# Patient Record
Sex: Female | Born: 1970
Health system: Southern US, Community
[De-identification: ages and names within clinical notes are randomized; demographics above are authoritative.]

## PROBLEM LIST (undated history)

## (undated) ENCOUNTER — Ambulatory Visit (HOSPITAL_COMMUNITY)

## (undated) DIAGNOSIS — I10 Essential (primary) hypertension: Secondary | ICD-10-CM

## (undated) DIAGNOSIS — F32A Depression, unspecified: Secondary | ICD-10-CM

## (undated) DIAGNOSIS — F329 Major depressive disorder, single episode, unspecified: Secondary | ICD-10-CM

## (undated) DIAGNOSIS — K219 Gastro-esophageal reflux disease without esophagitis: Secondary | ICD-10-CM

## (undated) HISTORY — PX: NO PAST SURGERIES: SHX2092

---
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
## 2011-06-12 ENCOUNTER — Other Ambulatory Visit (HOSPITAL_COMMUNITY)
Admission: RE | Admit: 2011-06-12 | Discharge: 2011-06-12 | Disposition: A | Discharge status: Home / Self Care | Payer: 59 | Source: Ambulatory Visit | Attending: Internal Medicine | Admitting: Internal Medicine

## 2011-06-12 DIAGNOSIS — Z01419 Encounter for gynecological examination (general) (routine) without abnormal findings: Secondary | ICD-10-CM | POA: Insufficient documentation

## 2011-06-30 ENCOUNTER — Encounter: Payer: Self-pay | Admitting: Gastroenterology

## 2011-11-21 ENCOUNTER — Encounter: Payer: Self-pay | Admitting: Gastroenterology

## 2012-12-19 ENCOUNTER — Other Ambulatory Visit: Payer: Self-pay

## 2012-12-19 DIAGNOSIS — Z1231 Encounter for screening mammogram for malignant neoplasm of breast: Secondary | ICD-10-CM

## 2013-01-03 ENCOUNTER — Ambulatory Visit
Admission: RE | Admit: 2013-01-03 | Discharge: 2013-01-03 | Disposition: A | Discharge status: Home / Self Care | Payer: 59 | Source: Ambulatory Visit

## 2013-01-03 DIAGNOSIS — Z1231 Encounter for screening mammogram for malignant neoplasm of breast: Secondary | ICD-10-CM

## 2013-01-08 ENCOUNTER — Other Ambulatory Visit: Payer: Self-pay | Admitting: Family Medicine

## 2013-01-08 ENCOUNTER — Ambulatory Visit
Admission: RE | Admit: 2013-01-08 | Discharge: 2013-01-08 | Disposition: A | Discharge status: Home / Self Care | Payer: 59 | Source: Ambulatory Visit | Attending: Family Medicine | Admitting: Family Medicine

## 2013-01-08 DIAGNOSIS — R0600 Dyspnea, unspecified: Secondary | ICD-10-CM

## 2013-08-27 ENCOUNTER — Other Ambulatory Visit: Payer: Self-pay | Admitting: Obstetrics and Gynecology

## 2013-08-27 DIAGNOSIS — D259 Leiomyoma of uterus, unspecified: Secondary | ICD-10-CM

## 2013-08-27 DIAGNOSIS — R319 Hematuria, unspecified: Secondary | ICD-10-CM

## 2013-09-02 ENCOUNTER — Ambulatory Visit
Admission: RE | Admit: 2013-09-02 | Discharge: 2013-09-02 | Disposition: A | Discharge status: Home / Self Care | Payer: 59 | Source: Ambulatory Visit | Attending: Obstetrics and Gynecology | Admitting: Obstetrics and Gynecology

## 2013-09-02 DIAGNOSIS — D259 Leiomyoma of uterus, unspecified: Secondary | ICD-10-CM

## 2013-09-02 DIAGNOSIS — R319 Hematuria, unspecified: Secondary | ICD-10-CM

## 2013-09-02 MED ORDER — GADOBENATE DIMEGLUMINE 529 MG/ML IV SOLN
19.0000 mL | Freq: Once | INTRAVENOUS | Status: AC | PRN
Start: 2013-09-02 — End: 2013-09-02
  Administered 2013-09-02: 19 mL via INTRAVENOUS

## 2013-11-18 ENCOUNTER — Ambulatory Visit
Admission: RE | Admit: 2013-11-18 | Discharge: 2013-11-18 | Disposition: A | Discharge status: Home / Self Care | Payer: 59 | Source: Ambulatory Visit

## 2013-11-18 ENCOUNTER — Encounter (INDEPENDENT_AMBULATORY_CARE_PROVIDER_SITE_OTHER): Payer: Self-pay

## 2013-11-18 ENCOUNTER — Other Ambulatory Visit: Payer: Self-pay

## 2013-11-18 DIAGNOSIS — Z1231 Encounter for screening mammogram for malignant neoplasm of breast: Secondary | ICD-10-CM

## 2015-04-15 ENCOUNTER — Other Ambulatory Visit: Payer: Self-pay | Admitting: Urology

## 2015-04-15 DIAGNOSIS — N361 Urethral diverticulum: Secondary | ICD-10-CM

## 2015-04-26 ENCOUNTER — Ambulatory Visit (HOSPITAL_COMMUNITY)
Admission: RE | Admit: 2015-04-26 | Discharge: 2015-04-26 | Disposition: A | Discharge status: Home / Self Care | Payer: 59 | Source: Ambulatory Visit | Attending: Urology | Admitting: Urology

## 2015-04-26 DIAGNOSIS — N361 Urethral diverticulum: Secondary | ICD-10-CM | POA: Insufficient documentation

## 2015-04-26 DIAGNOSIS — D251 Intramural leiomyoma of uterus: Secondary | ICD-10-CM | POA: Insufficient documentation

## 2015-04-26 DIAGNOSIS — N750 Cyst of Bartholin's gland: Secondary | ICD-10-CM | POA: Diagnosis not present

## 2015-04-26 LAB — POCT I-STAT CREATININE: CREATININE: 1.4 mg/dL — AB (ref 0.44–1.00)

## 2015-04-26 MED ORDER — GADOBENATE DIMEGLUMINE 529 MG/ML IV SOLN
20.0000 mL | Freq: Once | INTRAVENOUS | Status: AC | PRN
Start: 2015-04-26 — End: 2015-04-26
  Administered 2015-04-26: 20 mL via INTRAVENOUS

## 2015-05-28 ENCOUNTER — Inpatient Hospital Stay (HOSPITAL_COMMUNITY): Admission: RE | Admit: 2015-05-28 | Payer: 59 | Source: Ambulatory Visit | Admitting: Obstetrics and Gynecology

## 2015-05-28 ENCOUNTER — Encounter (HOSPITAL_COMMUNITY): Admission: RE | Payer: Self-pay | Source: Ambulatory Visit

## 2015-05-28 SURGERY — MYOMECTOMY, ABDOMINAL APPROACH
Anesthesia: General

## 2015-06-30 ENCOUNTER — Other Ambulatory Visit: Payer: Self-pay | Admitting: Obstetrics and Gynecology

## 2015-07-09 ENCOUNTER — Other Ambulatory Visit: Payer: Self-pay

## 2015-07-09 ENCOUNTER — Encounter (HOSPITAL_COMMUNITY): Payer: Self-pay

## 2015-07-09 ENCOUNTER — Encounter (HOSPITAL_COMMUNITY)
Admission: RE | Admit: 2015-07-09 | Discharge: 2015-07-09 | Disposition: A | Discharge status: Home / Self Care | Payer: 59 | Source: Ambulatory Visit | Attending: Obstetrics and Gynecology | Admitting: Obstetrics and Gynecology

## 2015-07-09 DIAGNOSIS — D509 Iron deficiency anemia, unspecified: Secondary | ICD-10-CM | POA: Diagnosis not present

## 2015-07-09 DIAGNOSIS — D259 Leiomyoma of uterus, unspecified: Secondary | ICD-10-CM | POA: Diagnosis not present

## 2015-07-09 DIAGNOSIS — Z79899 Other long term (current) drug therapy: Secondary | ICD-10-CM | POA: Diagnosis not present

## 2015-07-09 DIAGNOSIS — Z01812 Encounter for preprocedural laboratory examination: Secondary | ICD-10-CM | POA: Diagnosis not present

## 2015-07-09 DIAGNOSIS — Z975 Presence of (intrauterine) contraceptive device: Secondary | ICD-10-CM | POA: Insufficient documentation

## 2015-07-09 DIAGNOSIS — K219 Gastro-esophageal reflux disease without esophagitis: Secondary | ICD-10-CM | POA: Diagnosis not present

## 2015-07-09 DIAGNOSIS — Z01810 Encounter for preprocedural cardiovascular examination: Secondary | ICD-10-CM | POA: Diagnosis present

## 2015-07-09 DIAGNOSIS — F329 Major depressive disorder, single episode, unspecified: Secondary | ICD-10-CM | POA: Insufficient documentation

## 2015-07-09 DIAGNOSIS — I10 Essential (primary) hypertension: Secondary | ICD-10-CM | POA: Diagnosis not present

## 2015-07-09 DIAGNOSIS — F419 Anxiety disorder, unspecified: Secondary | ICD-10-CM | POA: Insufficient documentation

## 2015-07-09 DIAGNOSIS — Z6839 Body mass index (BMI) 39.0-39.9, adult: Secondary | ICD-10-CM | POA: Insufficient documentation

## 2015-07-09 HISTORY — DX: Gastro-esophageal reflux disease without esophagitis: K21.9

## 2015-07-09 HISTORY — DX: Essential (primary) hypertension: I10

## 2015-07-09 LAB — BASIC METABOLIC PANEL
Anion gap: 8 (ref 5–15)
BUN: 8 mg/dL (ref 6–20)
CALCIUM: 9.6 mg/dL (ref 8.9–10.3)
CO2: 27 mmol/L (ref 22–32)
Chloride: 101 mmol/L (ref 101–111)
Creatinine, Ser: 1.06 mg/dL — ABNORMAL HIGH (ref 0.44–1.00)
GLUCOSE: 91 mg/dL (ref 65–99)
Potassium: 3.9 mmol/L (ref 3.5–5.1)
Sodium: 136 mmol/L (ref 135–145)

## 2015-07-09 LAB — CBC
HCT: 36.5 % (ref 36.0–46.0)
Hemoglobin: 12.1 g/dL (ref 12.0–15.0)
MCH: 28.6 pg (ref 26.0–34.0)
MCHC: 33.2 g/dL (ref 30.0–36.0)
MCV: 86.3 fL (ref 78.0–100.0)
PLATELETS: 326 10*3/uL (ref 150–400)
RBC: 4.23 MIL/uL (ref 3.87–5.11)
RDW: 14 % (ref 11.5–15.5)
WBC: 7.9 10*3/uL (ref 4.0–10.5)

## 2015-07-09 NOTE — Patient Instructions (Addendum)
Your procedure is scheduled on:07/14/15  Enter through the Main Entrance at : 10:45 am Pick up desk phone and dial [phone] and inform us of your arrival.  Please call [phone] if you have any problems the morning of surgery.  Remember: Do not eat food after midnight:Tuesday Clear liquids are ok until:8am on WED   You may brush your teeth the morning of surgery.  Take these meds the morning of surgery with a sip of water:BP pill, Omeprazole  DO NOT wear jewelry, eye make-up, lipstick,body lotion, or dark fingernail polish.  (Polished toes are ok) You may wear deodorant.  If you are to be admitted after surgery, leave suitcase in car until your room has been assigned. Patients discharged on the day of surgery will not be allowed to drive home. Wear loose fitting, comfortable clothes for your ride home.

## 2015-07-13 MED ORDER — CEFAZOLIN SODIUM-DEXTROSE 2-3 GM-% IV SOLR
2.0000 g | INTRAVENOUS | Status: AC
  Administered 2015-07-14: 2 g via INTRAVENOUS

## 2015-07-13 NOTE — Anesthesia Preprocedure Evaluation (Addendum)
Anesthesia Evaluation  Patient identified by MRN, date of birth, ID band Patient awake    Reviewed: Allergy & Precautions, H&P , NPO status , Patient's Chart, lab work & pertinent test results  History of Anesthesia Complications Negative for: history of anesthetic complications  Airway Mallampati: I  TM Distance: >3 FB Neck ROM: full    Dental no notable dental hx.    Pulmonary neg pulmonary ROS,    Pulmonary exam normal breath sounds clear to auscultation       Cardiovascular hypertension, Pt. on medications Normal cardiovascular exam Rhythm:regular Rate:Normal     Neuro/Psych negative neurological ROS     GI/Hepatic Neg liver ROS, GERD  Medicated,  Endo/Other  Morbid obesity  Renal/GU negative Renal ROS     Musculoskeletal   Abdominal   Peds  Hematology negative hematology ROS (+)   Anesthesia Other Findings MRI reviewed: multiple uterine fibroids with largest measuring 12 x 8 x 12 cm  Hgb is 12 preop, Type and screen pending  Reproductive/Obstetrics negative OB ROS                          Anesthesia Physical Anesthesia Plan  ASA: III  Anesthesia Plan: General   Post-op Pain Management:    Induction: Intravenous  Airway Management Planned: Oral ETT  Additional Equipment: None  Intra-op Plan:   Post-operative Plan: Extubation in OR  Informed Consent: I have reviewed the patients History and Physical, chart, labs and discussed the procedure including the risks, benefits and alternatives for the proposed anesthesia with the patient or authorized representative who has indicated his/her understanding and acceptance.   Dental Advisory Given  Plan Discussed with: Anesthesiologist and CRNA  Anesthesia Plan Comments: (GA with ETT  Please get 2 PIVs)       Anesthesia Quick Evaluation

## 2015-07-14 ENCOUNTER — Encounter (HOSPITAL_COMMUNITY): Payer: Self-pay | Admitting: Anesthesiology

## 2015-07-14 ENCOUNTER — Encounter (HOSPITAL_COMMUNITY)
Admission: RE | Disposition: A | Discharge status: Home / Self Care | Payer: Self-pay | Source: Ambulatory Visit | Attending: Obstetrics and Gynecology

## 2015-07-14 ENCOUNTER — Inpatient Hospital Stay (HOSPITAL_COMMUNITY): Payer: 59 | Admitting: Anesthesiology

## 2015-07-14 ENCOUNTER — Inpatient Hospital Stay (HOSPITAL_COMMUNITY)
Admission: RE | Admit: 2015-07-14 | Discharge: 2015-07-15 | DRG: 743 | Disposition: A | Discharge status: Home / Self Care | Payer: 59 | Source: Ambulatory Visit | Attending: Obstetrics and Gynecology | Admitting: Obstetrics and Gynecology

## 2015-07-14 DIAGNOSIS — N920 Excessive and frequent menstruation with regular cycle: Secondary | ICD-10-CM | POA: Diagnosis present

## 2015-07-14 DIAGNOSIS — Z9889 Other specified postprocedural states: Secondary | ICD-10-CM

## 2015-07-14 DIAGNOSIS — Z6838 Body mass index (BMI) 38.0-38.9, adult: Secondary | ICD-10-CM | POA: Diagnosis not present

## 2015-07-14 DIAGNOSIS — Z30432 Encounter for removal of intrauterine contraceptive device: Secondary | ICD-10-CM | POA: Diagnosis not present

## 2015-07-14 DIAGNOSIS — D649 Anemia, unspecified: Secondary | ICD-10-CM | POA: Diagnosis present

## 2015-07-14 DIAGNOSIS — I10 Essential (primary) hypertension: Secondary | ICD-10-CM | POA: Diagnosis present

## 2015-07-14 DIAGNOSIS — D259 Leiomyoma of uterus, unspecified: Secondary | ICD-10-CM | POA: Diagnosis present

## 2015-07-14 DIAGNOSIS — K219 Gastro-esophageal reflux disease without esophagitis: Secondary | ICD-10-CM | POA: Diagnosis present

## 2015-07-14 HISTORY — PX: IUD REMOVAL: SHX5392

## 2015-07-14 HISTORY — PX: MYOMECTOMY: SHX85

## 2015-07-14 LAB — ABO/RH: ABO/RH(D): O POS

## 2015-07-14 LAB — PREGNANCY, URINE: Preg Test, Ur: NEGATIVE

## 2015-07-14 LAB — TYPE AND SCREEN
ABO/RH(D): O POS
ANTIBODY SCREEN: NEGATIVE

## 2015-07-14 SURGERY — MYOMECTOMY, ABDOMINAL APPROACH
Anesthesia: General | Site: Vagina

## 2015-07-14 MED ORDER — LACTATED RINGERS IV SOLN
INTRAVENOUS | Status: DC
  Administered 2015-07-14 (×2): via INTRAVENOUS

## 2015-07-14 MED ORDER — CEFAZOLIN SODIUM-DEXTROSE 2-3 GM-% IV SOLR
INTRAVENOUS | Status: AC
  Filled 2015-07-14: qty 50

## 2015-07-14 MED ORDER — HYDROMORPHONE HCL 1 MG/ML IJ SOLN
INTRAMUSCULAR | Status: DC | PRN
  Administered 2015-07-14 (×2): 0.5 mg via INTRAVENOUS

## 2015-07-14 MED ORDER — SODIUM CHLORIDE 0.9% FLUSH: 9.0000 mL | INTRAVENOUS | Status: DC | PRN

## 2015-07-14 MED ORDER — HYDROMORPHONE 1 MG/ML IV SOLN
INTRAVENOUS | Status: DC
  Administered 2015-07-14: 0.4 mg via INTRAVENOUS
  Administered 2015-07-14: 14:00:00 via INTRAVENOUS
  Administered 2015-07-14: 0.6 mg via INTRAVENOUS
  Administered 2015-07-15: 0.2 mg via INTRAVENOUS
  Filled 2015-07-14: qty 25

## 2015-07-14 MED ORDER — DIPHENHYDRAMINE HCL 50 MG/ML IJ SOLN
12.5000 mg | Freq: Four times a day (QID) | INTRAMUSCULAR | Status: DC | PRN
Start: 2015-07-14 — End: 2015-07-15

## 2015-07-14 MED ORDER — CHLOROPROCAINE HCL 1 % IJ SOLN
INTRAMUSCULAR | Status: AC
  Filled 2015-07-14: qty 30

## 2015-07-14 MED ORDER — VASOPRESSIN 20 UNIT/ML IV SOLN
INTRAVENOUS | Status: AC
  Filled 2015-07-14: qty 1

## 2015-07-14 MED ORDER — LOSARTAN POTASSIUM-HCTZ 100-12.5 MG PO TABS: 1.0000 | ORAL_TABLET | Freq: Every day | ORAL | Status: DC

## 2015-07-14 MED ORDER — HYDROMORPHONE HCL 1 MG/ML IJ SOLN: 0.2000 mg | INTRAMUSCULAR | Status: DC | PRN

## 2015-07-14 MED ORDER — PROMETHAZINE HCL 25 MG/ML IJ SOLN: 6.2500 mg | INTRAMUSCULAR | Status: DC | PRN

## 2015-07-14 MED ORDER — SIMETHICONE 80 MG PO CHEW: 80.0000 mg | CHEWABLE_TABLET | Freq: Four times a day (QID) | ORAL | Status: DC | PRN

## 2015-07-14 MED ORDER — HYDROMORPHONE HCL 1 MG/ML IJ SOLN
0.2500 mg | INTRAMUSCULAR | Status: DC | PRN
  Administered 2015-07-14 (×4): 0.5 mg via INTRAVENOUS

## 2015-07-14 MED ORDER — SODIUM CHLORIDE 0.9 % IJ SOLN
INTRAMUSCULAR | Status: AC
  Filled 2015-07-14: qty 50

## 2015-07-14 MED ORDER — PROPOFOL 10 MG/ML IV BOLUS
INTRAVENOUS | Status: AC
  Filled 2015-07-14: qty 20

## 2015-07-14 MED ORDER — FENTANYL CITRATE (PF) 250 MCG/5ML IJ SOLN
INTRAMUSCULAR | Status: AC
  Filled 2015-07-14: qty 5

## 2015-07-14 MED ORDER — ONDANSETRON HCL 4 MG/2ML IJ SOLN: 4.0000 mg | Freq: Four times a day (QID) | INTRAMUSCULAR | Status: DC | PRN

## 2015-07-14 MED ORDER — LIDOCAINE HCL (CARDIAC) 20 MG/ML IV SOLN
INTRAVENOUS | Status: DC | PRN
  Administered 2015-07-14: 7 mg via INTRAVENOUS
  Administered 2015-07-14: 30 mg via INTRAVENOUS

## 2015-07-14 MED ORDER — KETOROLAC TROMETHAMINE 30 MG/ML IJ SOLN
INTRAMUSCULAR | Status: AC
  Filled 2015-07-14: qty 1

## 2015-07-14 MED ORDER — PANTOPRAZOLE SODIUM 40 MG PO TBEC
40.0000 mg | DELAYED_RELEASE_TABLET | Freq: Every day | ORAL | Status: DC
  Administered 2015-07-14 – 2015-07-15 (×2): 40 mg via ORAL
  Filled 2015-07-14 (×2): qty 1

## 2015-07-14 MED ORDER — BUPIVACAINE HCL (PF) 0.25 % IJ SOLN
INTRAMUSCULAR | Status: DC | PRN
  Administered 2015-07-14: 8 mL

## 2015-07-14 MED ORDER — BUPIVACAINE HCL (PF) 0.25 % IJ SOLN
INTRAMUSCULAR | Status: AC
  Filled 2015-07-14: qty 30

## 2015-07-14 MED ORDER — NEOSTIGMINE METHYLSULFATE 10 MG/10ML IV SOLN
INTRAVENOUS | Status: AC
  Filled 2015-07-14: qty 1

## 2015-07-14 MED ORDER — ROCURONIUM BROMIDE 100 MG/10ML IV SOLN
INTRAVENOUS | Status: AC
  Filled 2015-07-14: qty 1

## 2015-07-14 MED ORDER — DEXAMETHASONE SODIUM PHOSPHATE 10 MG/ML IJ SOLN
INTRAMUSCULAR | Status: DC | PRN
  Administered 2015-07-14: 4 mg via INTRAVENOUS

## 2015-07-14 MED ORDER — ONDANSETRON HCL 4 MG/2ML IJ SOLN
INTRAMUSCULAR | Status: DC | PRN
  Administered 2015-07-14: 4 mg via INTRAVENOUS

## 2015-07-14 MED ORDER — DEXAMETHASONE SODIUM PHOSPHATE 4 MG/ML IJ SOLN
INTRAMUSCULAR | Status: AC
  Filled 2015-07-14: qty 1

## 2015-07-14 MED ORDER — KETOROLAC TROMETHAMINE 30 MG/ML IJ SOLN: 30.0000 mg | Freq: Four times a day (QID) | INTRAMUSCULAR | Status: DC

## 2015-07-14 MED ORDER — HYDROMORPHONE HCL 1 MG/ML IJ SOLN
INTRAMUSCULAR | Status: AC
  Filled 2015-07-14: qty 1

## 2015-07-14 MED ORDER — PROPOFOL 10 MG/ML IV BOLUS
INTRAVENOUS | Status: DC | PRN
  Administered 2015-07-14: 170 mg via INTRAVENOUS
  Administered 2015-07-14: 30 mg via INTRAVENOUS

## 2015-07-14 MED ORDER — ROCURONIUM BROMIDE 100 MG/10ML IV SOLN
INTRAVENOUS | Status: DC | PRN
  Administered 2015-07-14: 10 mg via INTRAVENOUS
  Administered 2015-07-14: 50 mg via INTRAVENOUS

## 2015-07-14 MED ORDER — SCOPOLAMINE 1 MG/3DAYS TD PT72
1.0000 | MEDICATED_PATCH | Freq: Once | TRANSDERMAL | Status: DC
  Administered 2015-07-14: 1.5 mg via TRANSDERMAL

## 2015-07-14 MED ORDER — LOSARTAN POTASSIUM 50 MG PO TABS
100.0000 mg | ORAL_TABLET | Freq: Every day | ORAL | Status: DC
  Administered 2015-07-14 – 2015-07-15 (×2): 100 mg via ORAL
  Filled 2015-07-14 (×3): qty 2

## 2015-07-14 MED ORDER — SCOPOLAMINE 1 MG/3DAYS TD PT72
MEDICATED_PATCH | TRANSDERMAL | Status: AC
  Administered 2015-07-14: 1.5 mg via TRANSDERMAL
  Filled 2015-07-14: qty 1

## 2015-07-14 MED ORDER — GLYCOPYRROLATE 0.2 MG/ML IJ SOLN
INTRAMUSCULAR | Status: DC | PRN
  Administered 2015-07-14: 0.6 mg via INTRAVENOUS

## 2015-07-14 MED ORDER — KETOROLAC TROMETHAMINE 30 MG/ML IJ SOLN
INTRAMUSCULAR | Status: DC | PRN
  Administered 2015-07-14: 30 mg via INTRAVENOUS

## 2015-07-14 MED ORDER — SODIUM CHLORIDE 0.9 % IV SOLN
INTRAVENOUS | Status: DC | PRN
  Administered 2015-07-14: 42 mL via INTRAMUSCULAR

## 2015-07-14 MED ORDER — ONDANSETRON HCL 4 MG/2ML IJ SOLN
INTRAMUSCULAR | Status: AC
  Filled 2015-07-14: qty 2

## 2015-07-14 MED ORDER — HYDROCHLOROTHIAZIDE 12.5 MG PO CAPS
12.5000 mg | ORAL_CAPSULE | Freq: Every day | ORAL | Status: DC
  Administered 2015-07-14 – 2015-07-15 (×2): 12.5 mg via ORAL
  Filled 2015-07-14 (×3): qty 1

## 2015-07-14 MED ORDER — OXYCODONE-ACETAMINOPHEN 5-325 MG PO TABS
1.0000 | ORAL_TABLET | ORAL | Status: DC | PRN
  Administered 2015-07-15 (×4): 1 via ORAL
  Filled 2015-07-14 (×4): qty 1

## 2015-07-14 MED ORDER — ONDANSETRON HCL 4 MG PO TABS: 4.0000 mg | ORAL_TABLET | Freq: Four times a day (QID) | ORAL | Status: DC | PRN

## 2015-07-14 MED ORDER — MENTHOL 3 MG MT LOZG: 1.0000 | LOZENGE | OROMUCOSAL | Status: DC | PRN

## 2015-07-14 MED ORDER — GLYCOPYRROLATE 0.2 MG/ML IJ SOLN
INTRAMUSCULAR | Status: AC
  Filled 2015-07-14: qty 3

## 2015-07-14 MED ORDER — VASOPRESSIN 20 UNIT/ML IV SOLN
INTRAVENOUS | Status: AC
Start: 2015-07-14 — End: 2015-07-14
  Filled 2015-07-14: qty 3

## 2015-07-14 MED ORDER — DEXTROSE IN LACTATED RINGERS 5 % IV SOLN
INTRAVENOUS | Status: DC
  Administered 2015-07-14 (×2): via INTRAVENOUS

## 2015-07-14 MED ORDER — FENTANYL CITRATE (PF) 100 MCG/2ML IJ SOLN
INTRAMUSCULAR | Status: DC | PRN
  Administered 2015-07-14 (×2): 50 ug via INTRAVENOUS
  Administered 2015-07-14: 100 ug via INTRAVENOUS
  Administered 2015-07-14: 50 ug via INTRAVENOUS

## 2015-07-14 MED ORDER — MIDAZOLAM HCL 2 MG/2ML IJ SOLN
INTRAMUSCULAR | Status: DC | PRN
  Administered 2015-07-14 (×2): 1 mg via INTRAVENOUS

## 2015-07-14 MED ORDER — NEOSTIGMINE METHYLSULFATE 10 MG/10ML IV SOLN
INTRAVENOUS | Status: DC | PRN
  Administered 2015-07-14: 4 mg via INTRAVENOUS

## 2015-07-14 MED ORDER — NALOXONE HCL 0.4 MG/ML IJ SOLN: 0.4000 mg | INTRAMUSCULAR | Status: DC | PRN

## 2015-07-14 MED ORDER — SODIUM CHLORIDE 0.9 % IJ SOLN
INTRAMUSCULAR | Status: AC
  Filled 2015-07-14: qty 100

## 2015-07-14 MED ORDER — IBUPROFEN 800 MG PO TABS
800.0000 mg | ORAL_TABLET | Freq: Three times a day (TID) | ORAL | Status: DC | PRN
  Administered 2015-07-15 (×2): 800 mg via ORAL
  Filled 2015-07-14 (×2): qty 1

## 2015-07-14 MED ORDER — KETOROLAC TROMETHAMINE 30 MG/ML IJ SOLN
30.0000 mg | Freq: Four times a day (QID) | INTRAMUSCULAR | Status: DC
  Administered 2015-07-14 (×2): 30 mg via INTRAVENOUS
  Filled 2015-07-14 (×3): qty 1

## 2015-07-14 MED ORDER — LIDOCAINE HCL (CARDIAC) 20 MG/ML IV SOLN
INTRAVENOUS | Status: AC
  Filled 2015-07-14: qty 5

## 2015-07-14 MED ORDER — DIPHENHYDRAMINE HCL 12.5 MG/5ML PO ELIX: 12.5000 mg | ORAL_SOLUTION | Freq: Four times a day (QID) | ORAL | Status: DC | PRN

## 2015-07-14 MED ORDER — MIDAZOLAM HCL 2 MG/2ML IJ SOLN
INTRAMUSCULAR | Status: AC
  Filled 2015-07-14: qty 2

## 2015-07-14 SURGICAL SUPPLY — 46 items
BARRIER ADHS 3X4 INTERCEED (GAUZE/BANDAGES/DRESSINGS) ×12 IMPLANT
CANISTER SUCT 3000ML (MISCELLANEOUS) ×4 IMPLANT
CATH ROBINSON RED A/P 16FR (CATHETERS) IMPLANT
CLOTH BEACON ORANGE TIMEOUT ST (SAFETY) ×4 IMPLANT
CONT PATH 16OZ SNAP LID 3702 (MISCELLANEOUS) ×4 IMPLANT
CONT SPEC PATH 64OZ SNAP LID (MISCELLANEOUS) ×4 IMPLANT
CONTAINER PREFILL 10% NBF 60ML (FORM) IMPLANT
DECANTER SPIKE VIAL GLASS SM (MISCELLANEOUS) ×4 IMPLANT
DRAPE CESAREAN BIRTH W POUCH (DRAPES) ×4 IMPLANT
DRSG OPSITE POSTOP 4X10 (GAUZE/BANDAGES/DRESSINGS) ×4 IMPLANT
DURAPREP 26ML APPLICATOR (WOUND CARE) ×4 IMPLANT
ELECT NEEDLE TIP 2.8 STRL (NEEDLE) ×4 IMPLANT
FILTER STRAW FLUID ASPIR (MISCELLANEOUS) IMPLANT
GAUZE SPONGE 4X4 16PLY XRAY LF (GAUZE/BANDAGES/DRESSINGS) ×4 IMPLANT
GLOVE BIOGEL PI IND STRL 7.0 (GLOVE) ×6 IMPLANT
GLOVE BIOGEL PI INDICATOR 7.0 (GLOVE) ×6
GLOVE ECLIPSE 6.5 STRL STRAW (GLOVE) ×4 IMPLANT
GOWN STRL REUS W/TWL LRG LVL3 (GOWN DISPOSABLE) ×12 IMPLANT
NEEDLE HYPO 22GX1.5 SAFETY (NEEDLE) ×4 IMPLANT
NEEDLE SPNL 22GX3.5 QUINCKE BK (NEEDLE) ×4 IMPLANT
NS IRRIG 1000ML POUR BTL (IV SOLUTION) ×4 IMPLANT
PACK ABDOMINAL GYN (CUSTOM PROCEDURE TRAY) ×4 IMPLANT
PACK VAGINAL MINOR WOMEN LF (CUSTOM PROCEDURE TRAY) ×4 IMPLANT
PAD OB MATERNITY 4.3X12.25 (PERSONAL CARE ITEMS) ×4 IMPLANT
PAD PREP 24X48 CUFFED NSTRL (MISCELLANEOUS) IMPLANT
RETRACTOR WND ALEXIS 25 LRG (MISCELLANEOUS) ×2 IMPLANT
RTRCTR WOUND ALEXIS 25CM LRG (MISCELLANEOUS) ×4
SPONGE GAUZE 4X4 12PLY STER LF (GAUZE/BANDAGES/DRESSINGS) ×4 IMPLANT
SPONGE LAP 18X18 X RAY DECT (DISPOSABLE) ×4 IMPLANT
STAPLER VISISTAT 35W (STAPLE) IMPLANT
SUT MON AB 3-0 SH 27 (SUTURE) ×2
SUT MON AB 3-0 SH27 (SUTURE) ×2 IMPLANT
SUT MON AB 4-0 PS1 27 (SUTURE) ×8 IMPLANT
SUT PLAIN 2 0 XLH (SUTURE) IMPLANT
SUT VIC AB 0 CT1 18XCR BRD8 (SUTURE) ×6 IMPLANT
SUT VIC AB 0 CT1 36 (SUTURE) ×8 IMPLANT
SUT VIC AB 0 CT1 8-18 (SUTURE) ×6
SUT VIC AB 2-0 CT1 27 (SUTURE) ×2
SUT VIC AB 2-0 CT1 TAPERPNT 27 (SUTURE) ×2 IMPLANT
SUT VIC AB 2-0 SH 27 (SUTURE) ×4
SUT VIC AB 2-0 SH 27XBRD (SUTURE) ×4 IMPLANT
SUT VIC AB 4-0 PS2 27 (SUTURE) IMPLANT
SYR CONTROL 10ML LL (SYRINGE) ×8 IMPLANT
TOWEL OR 17X24 6PK STRL BLUE (TOWEL DISPOSABLE) ×8 IMPLANT
TRAY FOLEY CATH SILVER 14FR (SET/KITS/TRAYS/PACK) ×4 IMPLANT
WATER STERILE IRR 1000ML POUR (IV SOLUTION) IMPLANT

## 2015-07-14 NOTE — Transfer of Care (Signed)
Immediate Anesthesia Transfer of Care Note  Patient: Annette Diaz  Procedure(s) Performed: Procedure(s): Exploratory Laparotomy MYOMECTOMY (N/A) INTRAUTERINE DEVICE (IUD) REMOVAL (N/A)  Patient Location: PACU  Anesthesia Type:General  Level of Consciousness: awake, oriented and patient cooperative  Airway & Oxygen Therapy: Patient Spontanous Breathing and Patient connected to nasal cannula oxygen  Post-op Assessment: Report given to RN and Post -op Vital signs reviewed and stable  Post vital signs: Reviewed and stable  Last Vitals:  Filed Vitals:   07/14/15 0840  BP: 127/83  Pulse: 77  Temp: 36.7 C  Resp: 18    Complications: No apparent anesthesia complications

## 2015-07-14 NOTE — Anesthesia Procedure Notes (Signed)
Procedure Name: Intubation Date/Time: 07/14/2015 9:31 AM Performed by: Tobin Chad Pre-anesthesia Checklist: Patient identified, Timeout performed, Emergency Drugs available, Suction available and Patient being monitored Patient Re-evaluated:Patient Re-evaluated prior to inductionOxygen Delivery Method: Circle system utilized and Simple face mask Preoxygenation: Pre-oxygenation with 100% oxygen Intubation Type: IV induction and Inhalational induction Ventilation: Mask ventilation without difficulty Laryngoscope Size: Mac and 3 Grade View: Grade I Tube type: Oral Tube size: 7.0 mm Number of attempts: 1 Placement Confirmation: ETT inserted through vocal cords under direct vision,  breath sounds checked- equal and bilateral and positive ETCO2 Secured at: 22 cm Tube secured with: Tape Dental Injury: Teeth and Oropharynx as per pre-operative assessment

## 2015-07-14 NOTE — Anesthesia Postprocedure Evaluation (Signed)
Anesthesia Post Note  Patient: Metallurgist  Procedure(s) Performed: Procedure(s) (LRB): Exploratory Laparotomy MYOMECTOMY (N/A) INTRAUTERINE DEVICE (IUD) REMOVAL (N/A)  Patient location during evaluation: PACU Anesthesia Type: General Level of consciousness: awake and alert Pain management: pain level controlled Vital Signs Assessment: post-procedure vital signs reviewed and stable Respiratory status: spontaneous breathing Cardiovascular status: blood pressure returned to baseline Anesthetic complications: no    Last Vitals:  Filed Vitals:   07/14/15 1348 07/14/15 1358  BP: 122/68   Pulse: 75   Temp: 36.7 C   Resp: 20 14    Last Pain:  Filed Vitals:   07/14/15 1401  PainSc: 0-No pain                 Tiajuana Amass

## 2015-07-14 NOTE — Brief Op Note (Signed)
07/14/2015  12:10 PM  PATIENT:  Annette Diaz  45 y.o. female  PRE-OPERATIVE DIAGNOSIS:  Fibroid Uterus, Anemia, Menorrhagia, IUD removal  POST-OPERATIVE DIAGNOSIS:  Fibroid Uterus, Anemia, Menorrhagia, IUD removal  PROCEDURE:  IUD removal, exploratory laparotomy, myomectomy  SURGEON:  Surgeon(s) and Role:    * Servando Salina, MD - Primary  PHYSICIAN ASSISTANT:   ASSISTANTS: Artelia Laroche, CNM   ANESTHESIA:   general Findings: large ant IM fibroid, several smaller fibroid, left ovary with adhesion lysed, nl right ov, nl tubes EBL:  Total I/O In: 1800 [I.V.:1800] Out: 650 [Urine:550; Blood:100]  BLOOD ADMINISTERED:none  DRAINS: none   LOCAL MEDICATIONS USED:  MARCAINE     SPECIMEN:  Source of Specimen:  myoma  DISPOSITION OF SPECIMEN:  PATHOLOGY  COUNTS:  YES  TOURNIQUET:  * No tourniquets in log *  DICTATION: .Other Dictation: Dictation Number [id number]  PLAN OF CARE: Admit to inpatient   PATIENT DISPOSITION:  PACU - hemodynamically stable.   Delay start of Pharmacological VTE agent (>24hrs) due to surgical blood loss or risk of bleeding: no

## 2015-07-15 ENCOUNTER — Encounter (HOSPITAL_COMMUNITY): Payer: Self-pay | Admitting: Obstetrics and Gynecology

## 2015-07-15 LAB — CBC
HEMATOCRIT: 30.4 % — AB (ref 36.0–46.0)
Hemoglobin: 10.1 g/dL — ABNORMAL LOW (ref 12.0–15.0)
MCH: 28.2 pg (ref 26.0–34.0)
MCHC: 33.2 g/dL (ref 30.0–36.0)
MCV: 84.9 fL (ref 78.0–100.0)
PLATELETS: 280 10*3/uL (ref 150–400)
RBC: 3.58 MIL/uL — ABNORMAL LOW (ref 3.87–5.11)
RDW: 14.2 % (ref 11.5–15.5)
WBC: 11 10*3/uL — AB (ref 4.0–10.5)

## 2015-07-15 MED ORDER — IBUPROFEN 800 MG PO TABS: 800.0000 mg | ORAL_TABLET | Freq: Three times a day (TID) | ORAL | Status: DC | PRN

## 2015-07-15 MED ORDER — OXYCODONE-ACETAMINOPHEN 5-325 MG PO TABS: 1.0000 | ORAL_TABLET | ORAL | Status: DC | PRN

## 2015-07-15 NOTE — Progress Notes (Signed)
Subjective: Patient reports tolerating PO, + flatus and no problems voiding.    Objective: I have reviewed patient's vital signs.  vital signs, intake and output and labs. Filed Vitals:   07/15/15 0525 07/15/15 1000  BP: 102/54 100/51  Pulse: 74 74  Temp: 98.7 F (37.1 C) 98.4 F (36.9 C)  Resp: 16 18   I/O last 3 completed shifts: In: 3974.8 [P.O.:480; I.V.:3494.8] Out: 2650 [Urine:2550; Blood:100] Total I/O In: 240 [P.O.:240] Out: 300 [Urine:300]  Lab Results  Component Value Date   WBC 11.0* 07/15/2015   HGB 10.1* 07/15/2015   HCT 30.4* 07/15/2015   MCV 84.9 07/15/2015   PLT 280 07/15/2015   Lab Results  Component Value Date   CREATININE 1.06* 07/09/2015    EXAM General: alert, cooperative and no distress Resp: clear to auscultation bilaterally Cardio: regular rate and rhythm, S1, S2 normal, no murmur, click, rub or gallop GI: incision: clean, dry and intact and soft obese, active BS throughout Extremities: no edema, redness or tenderness in the calves or thighs Vaginal Bleeding: minimal  Assessment: s/p Procedure(s): Exploratory Laparotomy MYOMECTOMY INTRAUTERINE DEVICE (IUD) REMOVAL: stable and tolerating diet  Plan: Encourage ambulation Advance to PO medication Discontinue IV fluids Discharge home  D/c instructions reviewed Staple removal Monday Scripts: percocet, motrin   LOS: 1 day    Lacy Taglieri A, MD 07/15/2015 12:56 PM    07/15/2015, 12:56 PM

## 2015-07-15 NOTE — Anesthesia Postprocedure Evaluation (Signed)
Anesthesia Post Note  Patient: Metallurgist  Procedure(s) Performed: Procedure(s) (LRB): Exploratory Laparotomy MYOMECTOMY (N/A) INTRAUTERINE DEVICE (IUD) REMOVAL (N/A)  Patient location during evaluation: [facility] Anesthesia Type: General Level of consciousness: awake and alert and oriented Pain management: satisfactory to patient Vital Signs Assessment: post-procedure vital signs reviewed and stable Respiratory status: spontaneous breathing and nonlabored ventilation Cardiovascular status: stable Postop Assessment: no signs of nausea or vomiting and adequate PO intake Anesthetic complications: no    Last Vitals:  Filed Vitals:   07/15/15 0153 07/15/15 0525  BP: 110/64 102/54  Pulse: 73 74  Temp: 36.8 C 37.1 C  Resp: 14 16    Last Pain:  Filed Vitals:   07/15/15 0739  PainSc: 5                  Derick Seminara

## 2015-07-15 NOTE — Discharge Instructions (Signed)
Call if temperature greater than equal to 100.4, nothing per vagina for 4-6 weeks or severe nausea vomiting, increased incisional pain , drainage or redness in the incision site, no straining with bowel movements, showers no bath °

## 2015-07-15 NOTE — Addendum Note (Signed)
Addendum  created 07/15/15 0845 by Jonna Munro, CRNA   Modules edited: Clinical Notes   Clinical Notes:  File: [id number]

## 2015-07-15 NOTE — Discharge Summary (Signed)
Physician Discharge Summary  Patient ID: Annette Diaz MRN: [id number] DOB/AGE: October 06, 1970 45 y.o.  Admit date: 07/14/2015 Discharge date: 07/15/2015  Admission Diagnoses: symptomatic uterine fibroid, IUD removal  Discharge Diagnoses: same Active Problems:   S/P myomectomy IUD removal  Discharged Condition: stable  Hospital Course: pt underwent IUD removal, exp lap, myomectomy. Uncomplicated postop course  Consults: None  Significant Diagnostic Studies: labs:  CBC Latest Ref Rng 07/15/2015 07/09/2015  WBC 4.0 - 10.5 K/uL 11.0(H) 7.9  Hemoglobin 12.0 - 15.0 g/dL 10.1(L) 12.1  Hematocrit 36.0 - 46.0 % 30.4(L) 36.5  Platelets 150 - 400 K/uL 280 326     Treatments: surgery: IUD removal, exp lap, myomectomy  Discharge Exam: Blood pressure 117/69, pulse 71, temperature 98.3 F (36.8 C), temperature source Oral, resp. rate 18, height 5' 2.5" (1.588 m), weight 96.163 kg (212 lb), last menstrual period 06/25/2015, SpO2 100 %. General appearance: alert, cooperative and no distress Resp: clear to auscultation bilaterally Cardio: regular rate and rhythm, S1, S2 normal, no murmur, click, rub or gallop GI: soft, non-tender; bowel sounds normal; no masses,  no organomegaly Pelvic: deferred Extremities: no edema, redness or tenderness in the calves or thighs Incision/Wound: c/d/intact  Disposition: Final discharge disposition not confirmed  Discharge Instructions    Call MD for:  persistant nausea and vomiting    Complete by:  As directed      Call MD for:  temperature >100.4    Complete by:  As directed      Diet - low sodium heart healthy    Complete by:  As directed      Discharge instructions    Complete by:  As directed   Call if temperature greater than equal to 100.4, nothing per vagina for 4-6 weeks or severe nausea vomiting, increased incisional pain , drainage or redness in the incision site, no straining with bowel movements, showers no bath     Discharge patient     Complete by:  As directed      Increase activity slowly    Complete by:  As directed      May shower / Bathe    Complete by:  As directed      May walk up steps    Complete by:  As directed             Medication List    TAKE these medications        ibuprofen 800 MG tablet  Commonly known as:  ADVIL,MOTRIN  Take 1 tablet (800 mg total) by mouth every 8 (eight) hours as needed (mild pain).     losartan-hydrochlorothiazide 100-12.5 MG tablet  Commonly known as:  HYZAAR  Take 1 tablet by mouth daily.     MULTIVITAMIN PO  Take 1 tablet by mouth daily.     omeprazole 20 MG capsule  Commonly known as:  PRILOSEC  Take 20 mg by mouth daily.     oxyCODONE-acetaminophen 5-325 MG tablet  Commonly known as:  PERCOCET/ROXICET  Take 1-2 tablets by mouth every 4 (four) hours as needed for severe pain (moderate to severe pain (when tolerating fluids)).     VITAMIN D PO  Take 1 tablet by mouth daily.           Follow-up Information    Follow up with Alyn Riedinger A, MD On 07/19/2015.   Specialty:  Obstetrics and Gynecology   Why:  staple removal   Contact information:   [place] [phone]  Follow up with Pranika Finks A, MD In 4 weeks.   Specialty:  Obstetrics and Gynecology   Contact information:   [address] [place] [address] [phone]       Signed: Servando Salina A 07/15/2015, 6:02 PM

## 2015-07-15 NOTE — Op Note (Signed)
Annette, Diaz            ACCOUNT NO.:  [id number]  MEDICAL RECORD NO.:  [id number]  LOCATION:  9305                          FACILITY:  [facility]  PHYSICIAN:  Servando Salina, M.D.DATE OF BIRTH:  Oct 24, 1970  DATE OF PROCEDURE:  07/14/2015 DATE OF DISCHARGE:                              OPERATIVE REPORT   PREOPERATIVE DIAGNOSIS:  Symptomatic fibroids, desires IUD removal.  PROCEDURE:  Removal of IUD, exploratory laparotomy, myomectomy.  POSTOPERATIVE DIAGNOSIS:  Symptomatic fibroids, desires IUD removal.  ANESTHESIA:  General.  SURGEON:  Servando Salina, M.D.  ASSISTANT:  Artelia Laroche, CNM.  DESCRIPTION OF PROCEDURE:  Under adequate general anesthesia, the patient was placed in the supine position.  She had an examination under anesthesia revealed a mobile uterus extending to the umbilicus.  No adnexal masses could be appreciated.  A bivalve speculum was placed in the vagina; and at that time, the IUD was identified and removed without difficulty.  The patient was then sterilely prepped and draped in usual fashion.  An indwelling Foley catheter was sterilely placed.  0.25% Marcaine was injected along the planned Pfannenstiel skin incision. Pfannenstiel skin incision was then made, carried down to the rectus fascia.  Rectus fascia was opened transversely.  Rectus fascia was then sharply and bluntly dissected off the rectus muscle in superior and inferior fashion.  The rectus muscle was split in midline.  The parietal peritoneum was entered bluntly and extended.  At that point, exploration revealed a large fibroid uterus with a prominent fibroid anteriorly. Upper abdomen was palpably normal.  A self-retaining Alexis retractor was subsequently placed.  Dilute solution of Pitressin was injected over that large anterior fibroid, and single-tooth tenaculum was then used to grasp the uterus and bring it out into the field.  Additional Pitressin was then injected.  Normal  tubes and ovaries were noted bilaterally. The left ovary was adherent to the posterior wall distally.  Additional, fibroid was palpated posteriorly and to the left of the midline.  With the needlepoint cautery, the fibroid was opened along the line, where the tenaculum had been placed.  The incision was extended in order to facilitate the removal of that fibroid.  The large fibroid was subsequently removed.  Palpation through the incision find an additional smaller fibroids superiorly that was removed as well.  When no other fibroids were felt to be able to be removed through the anterior incision, 0 Vicryl figure-of-eight suture was then placed and carried all the way up to the serosal surface.  Posteriorly, additional Pitressin was injected.  A small transverse incision was made overlying that fibroid and then removed.  Deeper palpation suggested another fibroid.  On continued opening, it was noted that was entry to the endometrium posteriorly and the seedling, but no definite fibroid was noted.  At that point, that incision was also closed with 0 Vicryl figure-of-eight sutures overlying very small open endometrium site.  The serosa on both incisions was then approximated with 3-0 Monocryl baseball stitch.  The abdomen was then irrigated and suctioned.  The attention was then turned to that left ovary which was able to be removed from its attachment posteriorly adhesion.  The Interceed was then placed overlying both incisions, and  the Alexis retractor was removed.  The uterus was then gently returned back to the abdomen.  The omentum was then placed overlying the uterus, and the parietal peritoneum was closed with 2-0 Vicryl.  The rectus fascia was closed with 0 Vicryl x2.  The subcutaneous area was irrigated, small bleeders cauterized, interrupted 2-0 plain sutures were placed, and the skin was approximated with Ethicon staples.  SPECIMEN:  Was myoma x4, sent to  Pathology.  ESTIMATED BLOOD LOSS:  100 mL.  INTRAOPERATIVE FLUID:  1 L.  URINE OUTPUT:  400 mL.  COUNT:  Sponge and instrument counts x2 correct.  COMPLICATION:  None.  The patient tolerated the procedure well and was transferred to recovery in stable condition.     Servando Salina, M.D.     [place]/MEDQ  D:  07/15/2015  T:  07/15/2015  Job:  [id number]

## 2015-07-15 NOTE — Progress Notes (Signed)
Discharge instructions given - pt d/c'd home.

## 2016-06-12 HISTORY — PX: BREAST SURGERY: SHX581

## 2016-09-13 DIAGNOSIS — N361 Urethral diverticulum: Secondary | ICD-10-CM | POA: Diagnosis not present

## 2016-09-13 DIAGNOSIS — N3000 Acute cystitis without hematuria: Secondary | ICD-10-CM | POA: Diagnosis not present

## 2016-09-15 DIAGNOSIS — R8290 Unspecified abnormal findings in urine: Secondary | ICD-10-CM | POA: Diagnosis not present

## 2016-10-17 DIAGNOSIS — Z01419 Encounter for gynecological examination (general) (routine) without abnormal findings: Secondary | ICD-10-CM | POA: Diagnosis not present

## 2016-10-17 DIAGNOSIS — Z1151 Encounter for screening for human papillomavirus (HPV): Secondary | ICD-10-CM | POA: Diagnosis not present

## 2016-10-17 DIAGNOSIS — Z6837 Body mass index (BMI) 37.0-37.9, adult: Secondary | ICD-10-CM | POA: Diagnosis not present

## 2017-01-22 DIAGNOSIS — N62 Hypertrophy of breast: Secondary | ICD-10-CM | POA: Diagnosis not present

## 2017-02-24 DIAGNOSIS — M791 Myalgia: Secondary | ICD-10-CM | POA: Diagnosis not present

## 2017-02-24 DIAGNOSIS — N760 Acute vaginitis: Secondary | ICD-10-CM | POA: Diagnosis not present

## 2017-02-24 DIAGNOSIS — N390 Urinary tract infection, site not specified: Secondary | ICD-10-CM | POA: Diagnosis not present

## 2017-03-07 DIAGNOSIS — N62 Hypertrophy of breast: Secondary | ICD-10-CM | POA: Diagnosis not present

## 2017-05-14 DIAGNOSIS — I10 Essential (primary) hypertension: Secondary | ICD-10-CM | POA: Diagnosis not present

## 2017-05-14 DIAGNOSIS — R300 Dysuria: Secondary | ICD-10-CM | POA: Diagnosis not present

## 2017-05-14 DIAGNOSIS — N898 Other specified noninflammatory disorders of vagina: Secondary | ICD-10-CM | POA: Diagnosis not present

## 2017-05-14 DIAGNOSIS — R102 Pelvic and perineal pain: Secondary | ICD-10-CM | POA: Diagnosis not present

## 2017-07-17 DIAGNOSIS — D251 Intramural leiomyoma of uterus: Secondary | ICD-10-CM | POA: Diagnosis not present

## 2017-07-17 DIAGNOSIS — D252 Subserosal leiomyoma of uterus: Secondary | ICD-10-CM | POA: Diagnosis not present

## 2017-07-17 DIAGNOSIS — R1031 Right lower quadrant pain: Secondary | ICD-10-CM | POA: Diagnosis not present

## 2017-07-31 DIAGNOSIS — E049 Nontoxic goiter, unspecified: Secondary | ICD-10-CM | POA: Diagnosis not present

## 2017-07-31 DIAGNOSIS — D649 Anemia, unspecified: Secondary | ICD-10-CM | POA: Diagnosis not present

## 2017-07-31 DIAGNOSIS — G629 Polyneuropathy, unspecified: Secondary | ICD-10-CM | POA: Diagnosis not present

## 2017-07-31 DIAGNOSIS — R7303 Prediabetes: Secondary | ICD-10-CM | POA: Diagnosis not present

## 2017-07-31 DIAGNOSIS — R102 Pelvic and perineal pain: Secondary | ICD-10-CM | POA: Diagnosis not present

## 2017-07-31 DIAGNOSIS — I10 Essential (primary) hypertension: Secondary | ICD-10-CM | POA: Diagnosis not present

## 2017-07-31 DIAGNOSIS — Z0000 Encounter for general adult medical examination without abnormal findings: Secondary | ICD-10-CM | POA: Diagnosis not present

## 2017-07-31 DIAGNOSIS — Z6835 Body mass index (BMI) 35.0-35.9, adult: Secondary | ICD-10-CM | POA: Diagnosis not present

## 2017-07-31 DIAGNOSIS — E559 Vitamin D deficiency, unspecified: Secondary | ICD-10-CM | POA: Diagnosis not present

## 2017-08-24 DIAGNOSIS — N62 Hypertrophy of breast: Secondary | ICD-10-CM | POA: Diagnosis not present

## 2017-08-27 DIAGNOSIS — Z1231 Encounter for screening mammogram for malignant neoplasm of breast: Secondary | ICD-10-CM | POA: Diagnosis not present

## 2017-09-04 ENCOUNTER — Other Ambulatory Visit: Payer: Self-pay | Admitting: Plastic Surgery

## 2017-09-04 DIAGNOSIS — Z411 Encounter for cosmetic surgery: Secondary | ICD-10-CM | POA: Diagnosis not present

## 2017-09-04 DIAGNOSIS — N62 Hypertrophy of breast: Secondary | ICD-10-CM | POA: Diagnosis not present

## 2017-09-04 DIAGNOSIS — E65 Localized adiposity: Secondary | ICD-10-CM | POA: Diagnosis not present

## 2017-09-04 DIAGNOSIS — N6011 Diffuse cystic mastopathy of right breast: Secondary | ICD-10-CM | POA: Diagnosis not present

## 2017-09-04 DIAGNOSIS — N6012 Diffuse cystic mastopathy of left breast: Secondary | ICD-10-CM | POA: Diagnosis not present

## 2018-04-05 DIAGNOSIS — N3000 Acute cystitis without hematuria: Secondary | ICD-10-CM | POA: Diagnosis not present

## 2018-05-14 DIAGNOSIS — K219 Gastro-esophageal reflux disease without esophagitis: Secondary | ICD-10-CM | POA: Diagnosis not present

## 2018-05-14 DIAGNOSIS — I10 Essential (primary) hypertension: Secondary | ICD-10-CM | POA: Diagnosis not present

## 2018-05-14 DIAGNOSIS — Z87442 Personal history of urinary calculi: Secondary | ICD-10-CM | POA: Diagnosis not present

## 2018-08-01 DIAGNOSIS — F4321 Adjustment disorder with depressed mood: Secondary | ICD-10-CM | POA: Diagnosis not present

## 2018-08-01 DIAGNOSIS — F410 Panic disorder [episodic paroxysmal anxiety] without agoraphobia: Secondary | ICD-10-CM | POA: Diagnosis not present

## 2018-09-12 DIAGNOSIS — F331 Major depressive disorder, recurrent, moderate: Secondary | ICD-10-CM | POA: Diagnosis not present

## 2018-09-18 DIAGNOSIS — I10 Essential (primary) hypertension: Secondary | ICD-10-CM | POA: Diagnosis not present

## 2018-09-18 DIAGNOSIS — K219 Gastro-esophageal reflux disease without esophagitis: Secondary | ICD-10-CM | POA: Diagnosis not present

## 2018-10-10 ENCOUNTER — Emergency Department (HOSPITAL_COMMUNITY): Payer: BC Managed Care – PPO

## 2018-10-10 ENCOUNTER — Emergency Department (HOSPITAL_COMMUNITY)
Admission: EM | Admit: 2018-10-10 | Discharge: 2018-10-10 | Disposition: A | Discharge status: Home / Self Care | Payer: BC Managed Care – PPO | Attending: Emergency Medicine | Admitting: Emergency Medicine

## 2018-10-10 ENCOUNTER — Other Ambulatory Visit: Payer: Self-pay

## 2018-10-10 DIAGNOSIS — N939 Abnormal uterine and vaginal bleeding, unspecified: Secondary | ICD-10-CM | POA: Insufficient documentation

## 2018-10-10 DIAGNOSIS — D259 Leiomyoma of uterus, unspecified: Secondary | ICD-10-CM | POA: Diagnosis not present

## 2018-10-10 DIAGNOSIS — Z79899 Other long term (current) drug therapy: Secondary | ICD-10-CM | POA: Insufficient documentation

## 2018-10-10 DIAGNOSIS — R7989 Other specified abnormal findings of blood chemistry: Secondary | ICD-10-CM | POA: Insufficient documentation

## 2018-10-10 DIAGNOSIS — I10 Essential (primary) hypertension: Secondary | ICD-10-CM | POA: Diagnosis not present

## 2018-10-10 LAB — HCG, QUANTITATIVE, PREGNANCY: hCG, Beta Chain, Quant, S: 2 m[IU]/mL (ref <5)

## 2018-10-10 LAB — CBC
HCT: 35.2 % — ABNORMAL LOW (ref 36.0–46.0)
Hemoglobin: 11.3 g/dL — ABNORMAL LOW (ref 12.0–15.0)
MCH: 28.1 pg (ref 26.0–34.0)
MCHC: 32.1 g/dL (ref 30.0–36.0)
MCV: 87.6 fL (ref 80.0–100.0)
Platelets: 347 10*3/uL (ref 150–400)
RBC: 4.02 MIL/uL (ref 3.87–5.11)
RDW: 14.6 % (ref 11.5–15.5)
WBC: 7.4 10*3/uL (ref 4.0–10.5)
nRBC: 0 % (ref 0.0–0.2)

## 2018-10-10 LAB — POC URINE PREG, ED: Preg Test, Ur: NEGATIVE

## 2018-10-10 LAB — BASIC METABOLIC PANEL
Anion gap: 9 (ref 5–15)
BUN: 9 mg/dL (ref 6–20)
CO2: 26 mmol/L (ref 22–32)
Calcium: 9.1 mg/dL (ref 8.9–10.3)
Chloride: 104 mmol/L (ref 98–111)
Creatinine, Ser: 1.25 mg/dL — ABNORMAL HIGH (ref 0.44–1.00)
GFR calc Af Amer: 59 mL/min — ABNORMAL LOW (ref >60)
GFR calc non Af Amer: 51 mL/min — ABNORMAL LOW (ref >60)
Glucose, Bld: 90 mg/dL (ref 70–99)
Potassium: 3.3 mmol/L — ABNORMAL LOW (ref 3.5–5.1)
Sodium: 139 mmol/L (ref 135–145)

## 2018-10-10 MED ORDER — POTASSIUM CHLORIDE CRYS ER 20 MEQ PO TBCR
40.0000 meq | EXTENDED_RELEASE_TABLET | Freq: Once | ORAL | Status: AC
  Administered 2018-10-10: 17:00:00 40 meq via ORAL
  Filled 2018-10-10: qty 2

## 2018-10-10 NOTE — ED Triage Notes (Signed)
Pt had + pregnancy test at home but began having vaginal bleeding today. Pt had - poc preg in ED before deciding on transfer to ED vs women's. Pt will stay in ED.

## 2018-10-10 NOTE — ED Notes (Signed)
Patient transported to Ultrasound 

## 2018-10-10 NOTE — ED Provider Notes (Signed)
[facility] EMERGENCY DEPARTMENT Provider Note   CSN: [id number] Arrival date & time: 10/10/18  1236    History   Chief Complaint Chief Complaint  Patient presents with   Vaginal Bleeding    HPI Annette Diaz is a 48 y.o. female.     HPI  Patient is a 48 year old female with past medical history of GERD, hypertension, and previous history of myomectomy for fibroids presenting for vaginal bleeding.  Patient reports that she began bleeding this morning and it rapidly escalated to large clots as well as what she felt like were harder products passing vaginally.  Patient ports that she had 3+ pregnancy test at home last week.  She has not yet had a primary care provider appointment, however she does have an appointment this coming Monday with her OB/GYN.  Patient denies history of miscarriage.  Her LMP was 4 to 5 months ago.  Patient reports this is not abnormal for her since the myomectomy.  Patient reports some mild abdominal cramping associated with vaginal bleeding but otherwise has no pain.  Patient has bled through 2-3 pads this morning.  She reports that the bleeding is slowing.  Denies vaginal discharge.  Patient does not take any hormonal contraception.  Patient is sexually active with one female partner and has unprotected sex.  Past Medical History:  Diagnosis Date   GERD (gastroesophageal reflux disease)    Hypertension     Patient Active Problem List   Diagnosis Date Noted   S/P myomectomy 07/14/2015    Past Surgical History:  Procedure Laterality Date   IUD REMOVAL N/A 07/14/2015   Procedure: INTRAUTERINE DEVICE (IUD) REMOVAL;  Surgeon: Servando Salina, MD;  Location: [facility];  Service: Gynecology;  Laterality: N/A;   MYOMECTOMY N/A 07/14/2015   Procedure: Exploratory Laparotomy MYOMECTOMY;  Surgeon: Servando Salina, MD;  Location: [facility];  Service: Gynecology;  Laterality: N/A;   NO PAST SURGERIES       OB History   No obstetric  history on file.      Home Medications    Prior to Admission medications   Medication Sig Start Date End Date Taking? Authorizing Provider  Cholecalciferol (VITAMIN D PO) Take 1 tablet by mouth daily.    [provider]  ibuprofen (ADVIL,MOTRIN) 800 MG tablet Take 1 tablet (800 mg total) by mouth every 8 (eight) hours as needed (mild pain). 07/15/15   Servando Salina, MD  losartan-hydrochlorothiazide (HYZAAR) 100-12.5 MG tablet Take 1 tablet by mouth daily.    [provider]  Multiple Vitamins-Minerals (MULTIVITAMIN PO) Take 1 tablet by mouth daily.    [provider]  omeprazole (PRILOSEC) 20 MG capsule Take 20 mg by mouth daily.    [provider]  oxyCODONE-acetaminophen (PERCOCET/ROXICET) 5-325 MG tablet Take 1-2 tablets by mouth every 4 (four) hours as needed for severe pain (moderate to severe pain (when tolerating fluids)). 07/15/15   Servando Salina, MD    Family History No family history on file.  Social History Social History   Tobacco Use   Smoking status: Never Smoker  Substance Use Topics   Alcohol use: No   Drug use: No     Allergies   Patient has no known allergies.   Review of Systems Review of Systems  Constitutional: Negative for chills and fever.  HENT: Negative for congestion and sore throat.   Eyes: Negative for visual disturbance.  Respiratory: Negative for chest tightness and shortness of breath.   Cardiovascular: Negative for chest pain  and palpitations.  Gastrointestinal: Negative for abdominal pain, nausea and vomiting.  Genitourinary: Positive for vaginal bleeding. Negative for dysuria, flank pain and pelvic pain.  Musculoskeletal: Negative for back pain and myalgias.  Skin: Negative for rash.  Neurological: Negative for dizziness, syncope and headaches.     Physical Exam Updated Vital Signs BP 140/77 (BP Location: Right Arm)    Pulse 72    Temp 97.8 F (36.6 C) (Oral)    Resp 16    SpO2 98%    Physical Exam Vitals signs and nursing note reviewed.  Constitutional:      General: She is not in acute distress.    Appearance: She is well-developed.  HENT:     Head: Normocephalic and atraumatic.     Mouth/Throat:     Mouth: Mucous membranes are moist.  Eyes:     Conjunctiva/sclera: Conjunctivae normal.     Pupils: Pupils are equal, round, and reactive to light.  Neck:     Musculoskeletal: Normal range of motion and neck supple.  Cardiovascular:     Rate and Rhythm: Normal rate and regular rhythm.     Heart sounds: S1 normal and S2 normal. No murmur.  Pulmonary:     Effort: Pulmonary effort is normal.     Breath sounds: Normal breath sounds. No wheezing or rales.  Abdominal:     General: There is no distension.     Palpations: Abdomen is soft.     Tenderness: There is no abdominal tenderness. There is no guarding.  Genitourinary:    Comments: Pelvic examination performed with nurse tech chaperone present.  Patient has no external lesions the vagina or perineum.  There is thin blood within the vaginal vault.  Cervix nonerythematous and nonfriable.  Cervical os is multiparous but closed. On bimanual exam, patient has no tenderness of uterine fundus or bilateral adnexal tenderness. Musculoskeletal: Normal range of motion.        General: No deformity.  Lymphadenopathy:     Cervical: No cervical adenopathy.  Skin:    General: Skin is warm and dry.     Findings: No erythema or rash.  Neurological:     Mental Status: She is alert.     Comments: Cranial nerves grossly intact. Patient moves extremities symmetrically and with good coordination.  Psychiatric:        Behavior: Behavior normal.        Thought Content: Thought content normal.        Judgment: Judgment normal.      ED Treatments / Results  Labs (all labs ordered are listed, but only abnormal results are displayed) Labs Reviewed  BASIC METABOLIC PANEL - Abnormal; Notable for the following components:       Result Value   Potassium 3.3 (*)    Creatinine, Ser 1.25 (*)    GFR calc non Af Amer 51 (*)    GFR calc Af Amer 59 (*)    All other components within normal limits  CBC - Abnormal; Notable for the following components:   Hemoglobin 11.3 (*)    HCT 35.2 (*)    All other components within normal limits  HCG, QUANTITATIVE, PREGNANCY  POC URINE PREG, ED    EKG None  Radiology US Transvaginal Non-ob  Result Date: 10/10/2018 CLINICAL DATA:  48 year old female with a 2 day history of vaginal bleeding. Clinical history includes uterine fibroids and a history of myomectomy in 2017. EXAM: TRANSABDOMINAL AND TRANSVAGINAL ULTRASOUND OF PELVIS TECHNIQUE: Both transabdominal and transvaginal ultrasound  examinations of the pelvis were performed. Transabdominal technique was performed for global imaging of the pelvis including uterus, ovaries, adnexal regions, and pelvic cul-de-sac. It was necessary to proceed with endovaginal exam following the transabdominal exam to visualize the endometrium. COMPARISON:  Prior pelvic ultrasound 04/26/2015 FINDINGS: Uterus Measurements: 8.8 x 6.2 x 6.1 cm = volume: 176 mL. Multiple rounded heterogeneous soft tissue masses are present throughout the myometrium consistent with uterine fibroids. The largest is located in the anterior aspect of the fundus and measures 4.2 x 2.8 x 3.4 cm. A second, smaller fibroid is present in the right posterior upper uterine segment measuring 1.5 x 1.5 x 1.3 cm. A third fibroid is present in the left anterior mid uterine segment measuring 1.7 x 1.5 x 1.5 cm. Endometrium Thickness: 5 mm.  No focal abnormality visualized. Right ovary Unable to visualize. Left ovary Measurements: 2.1 x 1.4 x 1.5 cm = volume: 2.3 mL. Normal appearance/no adnexal mass. Other findings No abnormal free fluid. IMPRESSION: 1. Mildly enlarged and heterogeneous uterus containing at least 3 uterine fibroids. 2. Normal appearance of the endometrium. 3. Nonvisualization of  the right ovary. 4. The left ovary is normal in appearance. Electronically Signed   By: Jacqulynn Cadet M.D.   On: 10/10/2018 15:17   US Pelvis Complete  Result Date: 10/10/2018 CLINICAL DATA:  48 year old female with a 2 day history of vaginal bleeding. Clinical history includes uterine fibroids and a history of myomectomy in 2017. EXAM: TRANSABDOMINAL AND TRANSVAGINAL ULTRASOUND OF PELVIS TECHNIQUE: Both transabdominal and transvaginal ultrasound examinations of the pelvis were performed. Transabdominal technique was performed for global imaging of the pelvis including uterus, ovaries, adnexal regions, and pelvic cul-de-sac. It was necessary to proceed with endovaginal exam following the transabdominal exam to visualize the endometrium. COMPARISON:  Prior pelvic ultrasound 04/26/2015 FINDINGS: Uterus Measurements: 8.8 x 6.2 x 6.1 cm = volume: 176 mL. Multiple rounded heterogeneous soft tissue masses are present throughout the myometrium consistent with uterine fibroids. The largest is located in the anterior aspect of the fundus and measures 4.2 x 2.8 x 3.4 cm. A second, smaller fibroid is present in the right posterior upper uterine segment measuring 1.5 x 1.5 x 1.3 cm. A third fibroid is present in the left anterior mid uterine segment measuring 1.7 x 1.5 x 1.5 cm. Endometrium Thickness: 5 mm.  No focal abnormality visualized. Right ovary Unable to visualize. Left ovary Measurements: 2.1 x 1.4 x 1.5 cm = volume: 2.3 mL. Normal appearance/no adnexal mass. Other findings No abnormal free fluid. IMPRESSION: 1. Mildly enlarged and heterogeneous uterus containing at least 3 uterine fibroids. 2. Normal appearance of the endometrium. 3. Nonvisualization of the right ovary. 4. The left ovary is normal in appearance. Electronically Signed   By: Jacqulynn Cadet M.D.   On: 10/10/2018 15:17    Procedures Procedures (including critical care time)  Medications Ordered in ED Medications - No data to  display   Initial Impression / Assessment and Plan / ED Course  I have reviewed the triage vital signs and the nursing notes.  Pertinent labs & imaging results that were available during my care of the patient were reviewed by me and considered in my medical decision making (see chart for details).  Clinical Course as of Oct 10 1534  Thu Oct 10, 2018  1419 Improved from prior value 3 years ago.  Hemoglobin(!): 11.3 [AM]  1440 Overall at baseline.   Creatinine(!): 1.25 [AM]  5573 Spoke with Dr. Valentino Saxon of [facility] who  recommends [place], and if no abnormal findings, recommend following up as per previous appointment. Unclear if pt had a miscarriage since there is no laboratory-confirmed pregnancy.  Appreciate her involvement.   [AM]  1535 Patient reports significant decrease in bleeding currently. No abdominal cramping.    [AM]    Clinical Course User Index [AM] Albesa Seen, PA-C       Patient is nontoxic-appearing, hemodynamically stable and in no acute distress.  Abdominal exam is benign.  Patient with active vaginal bleeding on exam.  Patient reporting this is abnormal for her as she usually spots.  Differential diagnosis includes miscarriage versus abnormal uterine bleeding in the setting of perimenopause.  Work-up demonstrating stable hemoglobin of 11.3, improved from prior reading 3 years ago.  Slight hypokalemia repleted.  Patient has slight elevation in her creatinine, however most recent data is from 3 years ago.  Pelvic ultrasound performed which demonstrates no obvious products of conception retained.  She continues to demonstrate multiple fibroids with largest fibroid 4.2 x 2.8 x 3.4 cm.  Per discussion with Dr. Valentino Saxon of [facility], patient is to follow-up with her previously scheduled appointment.  Given that patient never had a positive pregnancy test performed in a medical setting, it is possible that patient had transient elevations in hCG, and this is  indicative of dysfunctional uterine bleeding rather than miscarriage.  Results discussed with patient, and patient instructed to return if she develops any acute shortness of breath, palpitations, dizziness, lightheadedness or sudden worsening of her vaginal bleeding.  Patient is in understanding and agrees with the plan of care.  Final Clinical Impressions(s) / ED Diagnoses   Final diagnoses:  Abnormal uterine bleeding  Elevated serum creatinine    ED Discharge Orders    None       Tamala Julian 10/10/18 1537    Quintella Reichert, MD 10/10/18 1708

## 2018-10-10 NOTE — Discharge Instructions (Signed)
Please see the information and instructions below regarding your visit.  Your diagnoses today include:  1. Abnormal uterine bleeding   2. Elevated serum creatinine    Your work-up is very reassuring today.  Since your pregnancy hormone was negative, it is unclear if you are experiencing a miscarriage versus if you had a transient elevation in your pregnancy hormone in the bloodstream.  At this time, there is no intervention recommended.   Tests performed today include: See side panel of your discharge paperwork for testing performed today. Vital signs are listed at the bottom of these instructions.   Your hemoglobin is stable today.  It is improved from prior readings.  Your ultrasound shows no retained products of conception that we can see.  You do have some smaller fibroids than the large one that was removed.   Medications prescribed:    Take any prescribed medications only as prescribed, and any over the counter medications only as directed on the packaging.  Home care instructions:  Please follow any educational materials contained in this packet.   Follow-up instructions: Please follow-up with Dr. Garwin Brothers on Monday as previously scheduled.  Please discuss future fertility with Dr. cousins.  Return instructions:  Please return to the Emergency Department if you experience worsening symptoms.  Please return to the emergency department if you develop any chest pain, shortness of breath, palpitations, feeling dizzy or lightheaded, or notices sudden increase in your vaginal bleeding from what she experienced today and you are rapidly changing pads or tampons Please return if you have any other emergent concerns.  Additional Information:   Your vital signs today were: BP 140/77 (BP Location: Right Arm)    Pulse 72    Temp 97.8 F (36.6 C) (Oral)    Resp 16    SpO2 98%  If your blood pressure (BP) was elevated on multiple readings during this visit above 130 for the top number or  above 80 for the bottom number, please have this repeated by your primary care provider within one month. --------------  Thank you for allowing [place] to participate in your care today.

## 2018-10-11 DIAGNOSIS — F4321 Adjustment disorder with depressed mood: Secondary | ICD-10-CM | POA: Diagnosis not present

## 2018-10-11 DIAGNOSIS — F331 Major depressive disorder, recurrent, moderate: Secondary | ICD-10-CM | POA: Diagnosis not present

## 2018-10-28 DIAGNOSIS — Z1151 Encounter for screening for human papillomavirus (HPV): Secondary | ICD-10-CM | POA: Diagnosis not present

## 2018-10-28 DIAGNOSIS — Z6836 Body mass index (BMI) 36.0-36.9, adult: Secondary | ICD-10-CM | POA: Diagnosis not present

## 2018-10-28 DIAGNOSIS — N939 Abnormal uterine and vaginal bleeding, unspecified: Secondary | ICD-10-CM | POA: Diagnosis not present

## 2018-10-28 DIAGNOSIS — Z124 Encounter for screening for malignant neoplasm of cervix: Secondary | ICD-10-CM | POA: Diagnosis not present

## 2018-10-28 DIAGNOSIS — D252 Subserosal leiomyoma of uterus: Secondary | ICD-10-CM | POA: Diagnosis not present

## 2018-10-28 DIAGNOSIS — Z01419 Encounter for gynecological examination (general) (routine) without abnormal findings: Secondary | ICD-10-CM | POA: Diagnosis not present

## 2018-10-28 DIAGNOSIS — D251 Intramural leiomyoma of uterus: Secondary | ICD-10-CM | POA: Diagnosis not present

## 2018-10-28 DIAGNOSIS — Z1231 Encounter for screening mammogram for malignant neoplasm of breast: Secondary | ICD-10-CM | POA: Diagnosis not present

## 2018-11-06 DIAGNOSIS — N939 Abnormal uterine and vaginal bleeding, unspecified: Secondary | ICD-10-CM | POA: Diagnosis not present

## 2018-11-13 ENCOUNTER — Other Ambulatory Visit: Payer: Self-pay | Admitting: Obstetrics and Gynecology

## 2018-11-13 DIAGNOSIS — D250 Submucous leiomyoma of uterus: Secondary | ICD-10-CM

## 2018-11-14 DIAGNOSIS — F410 Panic disorder [episodic paroxysmal anxiety] without agoraphobia: Secondary | ICD-10-CM | POA: Diagnosis not present

## 2018-11-14 DIAGNOSIS — F331 Major depressive disorder, recurrent, moderate: Secondary | ICD-10-CM | POA: Diagnosis not present

## 2018-11-14 DIAGNOSIS — F4321 Adjustment disorder with depressed mood: Secondary | ICD-10-CM | POA: Diagnosis not present

## 2018-11-19 ENCOUNTER — Inpatient Hospital Stay
Admission: RE | Admit: 2018-11-19 | Discharge: 2018-11-19 | Disposition: A | Discharge status: Home / Self Care | Payer: BC Managed Care – PPO | Source: Ambulatory Visit | Attending: Interventional Radiology | Admitting: Interventional Radiology

## 2018-11-19 ENCOUNTER — Other Ambulatory Visit: Payer: BC Managed Care – PPO

## 2018-11-19 ENCOUNTER — Other Ambulatory Visit: Payer: Self-pay

## 2018-11-19 DIAGNOSIS — D250 Submucous leiomyoma of uterus: Secondary | ICD-10-CM

## 2018-11-19 DIAGNOSIS — D259 Leiomyoma of uterus, unspecified: Secondary | ICD-10-CM | POA: Diagnosis not present

## 2018-11-19 HISTORY — PX: IR RADIOLOGIST EVAL & MGMT: IMG5224

## 2018-11-19 NOTE — Consult Note (Signed)
Chief Complaint: Symptomatic Uterine Fibroids  Referring Physician(s): Dr. Garwin Brothers  History of Present Illness: Annette Diaz is a 48 y.o. female presenting as a scheduled consultation to [facility] today, kindly referred by Dr. Garwin Brothers, for evaluation for potential candidacy for uterine artery embolization for her symptomatic uterine fibroids.   Annette Diaz and I had a telemedicine visit today given the COVID-19 situation.  I confirmed identity with 2 identifiers.   She tells me that she has experienced symptoms in all three of the typical categories for fibroids, which includes bleeding, pain, and bulk symptoms.  She has, in the past, been treated with myomectomy for a large fibroid, mostly for bulk symptoms and pain, in 2016.    Her complains are of ongoing pain and pelvic heaviness, as well as bleeding.  She has what she describes as ongoing spotting, which sounds nearly continuous.  She has never needed blood transfusion, though recent CBC from 09/2018 shows H&H of 11.3/35.2.  She denies any dyspareunia, though says intercourse does cause her to have more bleeding.    She has had a recent biopsy which was negative for malignancy.    She is very decisive that she does not want surgical hysterectomy, which is her main motivation for seeking our opinion today.   She has, in the past, had MRI performed, but this predated her myomectomy. She has no interest in having more children.   Past Medical History:  Diagnosis Date   GERD (gastroesophageal reflux disease)    Hypertension     Past Surgical History:  Procedure Laterality Date   IUD REMOVAL N/A 07/14/2015   Procedure: INTRAUTERINE DEVICE (IUD) REMOVAL;  Surgeon: Servando Salina, MD;  Location: [facility];  Service: Gynecology;  Laterality: N/A;   MYOMECTOMY N/A 07/14/2015   Procedure: Exploratory Laparotomy MYOMECTOMY;  Surgeon: Servando Salina, MD;  Location: [facility];  Service: Gynecology;  Laterality: N/A;   NO PAST  SURGERIES      Allergies: Patient has no known allergies.  Medications: Prior to Admission medications   Medication Sig Start Date End Date Taking? Authorizing Provider  Cholecalciferol (VITAMIN D PO) Take 1 tablet by mouth daily.    [provider]  ibuprofen (ADVIL,MOTRIN) 800 MG tablet Take 1 tablet (800 mg total) by mouth every 8 (eight) hours as needed (mild pain). 07/15/15   Servando Salina, MD  losartan-hydrochlorothiazide (HYZAAR) 100-12.5 MG tablet Take 1 tablet by mouth daily.    [provider]  Multiple Vitamins-Minerals (MULTIVITAMIN PO) Take 1 tablet by mouth daily.    [provider]  omeprazole (PRILOSEC) 20 MG capsule Take 20 mg by mouth daily.    [provider]  oxyCODONE-acetaminophen (PERCOCET/ROXICET) 5-325 MG tablet Take 1-2 tablets by mouth every 4 (four) hours as needed for severe pain (moderate to severe pain (when tolerating fluids)). 07/15/15   Servando Salina, MD     No family history on file.  Social History   Socioeconomic History   Marital status: Single    Spouse name: Not on file   Number of children: Not on file   Years of education: Not on file   Highest education level: Not on file  Occupational History   Not on file  Social Needs   Financial resource strain: Not on file   Food insecurity:    Worry: Not on file    Inability: Not on file   Transportation needs:    Medical: Not on file    Non-medical: Not on file  Tobacco Use   Smoking status: Never Smoker  Substance and Sexual Activity   Alcohol use: No   Drug use: No   Sexual activity: Not on file  Lifestyle   Physical activity:    Days per week: Not on file    Minutes per session: Not on file   Stress: Not on file  Relationships   Social connections:    Talks on phone: Not on file    Gets together: Not on file    Attends religious service: Not on file    Active member of club or organization: Not on file    Attends  meetings of clubs or organizations: Not on file    Relationship status: Not on file  Other Topics Concern   Not on file  Social History Narrative   Not on file      Review of Systems  Review of Systems: A 12 point ROS discussed and pertinent positives are indicated in the HPI above.  All other systems are negative.  Physical Exam No direct physical exam was performed (except for noted visual exam findings with Video Visits).    Vital Signs: There were no vitals taken for this visit.  Imaging: No results found.  Labs:  CBC: Recent Labs    10/10/18 1352  WBC 7.4  HGB 11.3*  HCT 35.2*  PLT 347    COAGS: No results for input(s): INR, APTT in the last 8760 hours.  BMP: Recent Labs    10/10/18 1352  NA 139  K 3.3*  CL 104  CO2 26  GLUCOSE 90  BUN 9  CALCIUM 9.1  CREATININE 1.25*  GFRNONAA 51*  GFRAA 59*    LIVER FUNCTION TESTS: No results for input(s): BILITOT, AST, ALT, ALKPHOS, PROT, ALBUMIN in the last 8760 hours.  TUMOR MARKERS: No results for input(s): AFPTM, CEA, CA199, CHROMGRNA in the last 8760 hours.  Assessment and Plan:  Annette Diaz is 48 year old female with symptomatic uterine fibroids.   The majority of our discussion today was regarding the anatomy, pathology/pathophysiology of fibroids, treatment options, and specifically [place].  Regarding treatment options, I did advise that the typical options include hormone therapy, surgical therapy with variation of hysterectomy, our option with embolization, and also high-intensity focused [place] (HIFU).    Regarding our treatment option, I think she is a good candidate, with anticipated efficacy of >90%, and a low likelihood of any required re-treatment.  I did let her know that while the majority of women are very satisfied with the outcome, this can take up to 12 months after therapy to have full benefit, as there will be ongoing involution over time.  Specific risks that were discussed include:  bleeding, infection, endometritis, need for hospitalization, need for further procedure surgery including possible hysterectomy, non-target embolization, early ovarian failure/menopause, contrast reaction, kidney injury, sedation complication, cardiopulmonary collapse, death.   After our discussion, she would like to consider further, and will be following up with our clinic should she like to proceed.   Plan: - If she would like to proceed with uterine artery embolization for her symptomatic fibroids, she will be following up with our clinic. - If we proceed, then we will schedule a pelvic MRI without and with contrast for pre-procedure evaluation.  - Possible peri-operative hypogastric nerve block, if we proceed. - I have advised her to observe her other physician appointments  Thank you for this interesting consult.  I greatly enjoyed meeting [employer] and look forward to  participating in their care.  A copy of this report was sent to the requesting provider on this date.  Electronically Signed: Corrie Mckusick 11/19/2018, 4:35 PM   I spent a total of  40 Minutes   in remote  clinical consultation, greater than 50% of which was counseling/coordinating care for symptomatic uterine fibroids, possible uterine artery embolization, possible hypogastric nerve block.    Visit type: Audio and video (WebEx).   Alternative for in-person consultation at [facility] [address], [place], [place]. This visit type was conducted due to national recommendations for restrictions regarding the COVID-19 Pandemic (e.g. social distancing).  This format is felt to be most appropriate for this patient at this time.  All issues noted in this document were discussed and addressed.

## 2018-11-21 ENCOUNTER — Encounter: Payer: Self-pay | Admitting: *Deleted

## 2018-12-13 DIAGNOSIS — F3342 Major depressive disorder, recurrent, in full remission: Secondary | ICD-10-CM | POA: Diagnosis not present

## 2018-12-24 ENCOUNTER — Other Ambulatory Visit: Payer: Self-pay | Admitting: Obstetrics and Gynecology

## 2018-12-24 DIAGNOSIS — D259 Leiomyoma of uterus, unspecified: Secondary | ICD-10-CM

## 2018-12-31 DIAGNOSIS — N898 Other specified noninflammatory disorders of vagina: Secondary | ICD-10-CM | POA: Diagnosis not present

## 2018-12-31 DIAGNOSIS — Z113 Encounter for screening for infections with a predominantly sexual mode of transmission: Secondary | ICD-10-CM | POA: Diagnosis not present

## 2019-01-14 ENCOUNTER — Ambulatory Visit
Admission: RE | Admit: 2019-01-14 | Discharge: 2019-01-14 | Disposition: A | Discharge status: Home / Self Care | Payer: BC Managed Care – PPO | Source: Ambulatory Visit | Attending: Obstetrics and Gynecology | Admitting: Obstetrics and Gynecology

## 2019-01-14 ENCOUNTER — Other Ambulatory Visit: Payer: Self-pay

## 2019-01-14 DIAGNOSIS — F4321 Adjustment disorder with depressed mood: Secondary | ICD-10-CM | POA: Diagnosis not present

## 2019-01-14 DIAGNOSIS — D259 Leiomyoma of uterus, unspecified: Secondary | ICD-10-CM | POA: Diagnosis not present

## 2019-01-14 DIAGNOSIS — F410 Panic disorder [episodic paroxysmal anxiety] without agoraphobia: Secondary | ICD-10-CM | POA: Diagnosis not present

## 2019-01-14 MED ORDER — GADOBENATE DIMEGLUMINE 529 MG/ML IV SOLN
20.0000 mL | Freq: Once | INTRAVENOUS | Status: AC | PRN
  Administered 2019-01-14: 20 mL via INTRAVENOUS

## 2019-01-23 DIAGNOSIS — R922 Inconclusive mammogram: Secondary | ICD-10-CM | POA: Diagnosis not present

## 2019-01-23 DIAGNOSIS — N6489 Other specified disorders of breast: Secondary | ICD-10-CM | POA: Diagnosis not present

## 2019-02-12 ENCOUNTER — Other Ambulatory Visit (HOSPITAL_COMMUNITY): Payer: Self-pay | Admitting: Interventional Radiology

## 2019-02-12 DIAGNOSIS — D259 Leiomyoma of uterus, unspecified: Secondary | ICD-10-CM

## 2019-03-12 DIAGNOSIS — N761 Subacute and chronic vaginitis: Secondary | ICD-10-CM | POA: Diagnosis not present

## 2019-03-12 DIAGNOSIS — R1033 Periumbilical pain: Secondary | ICD-10-CM | POA: Diagnosis not present

## 2019-03-12 DIAGNOSIS — I10 Essential (primary) hypertension: Secondary | ICD-10-CM | POA: Diagnosis not present

## 2019-04-11 ENCOUNTER — Ambulatory Visit: Payer: Self-pay | Admitting: General Surgery

## 2019-04-11 DIAGNOSIS — K429 Umbilical hernia without obstruction or gangrene: Secondary | ICD-10-CM | POA: Diagnosis not present

## 2019-04-11 NOTE — H&P (Signed)
History of Present Illness Ralene Ok MD; 04/11/2019 10:43 AM) The patient is a 48 year old female who presents with an umbilical hernia. Referred by: Dr. cousins Chief Complaint: Umbilical hernia  Patient is a 48 year old female comes in with a history of hypertension, depression, and an umbilical hernia. Patient states that this is been there for approximately 1 year. He states his gotten more painful recently. She states that she would become more active and her job and doing some lifting which is likely cause more for discomfort and pain. She states that it has somewhat gotten somewhat larger. She states that her pain is more in epigastric region versus he umbilical region. Patient did have an MRI in August 2020. He reviewed this personally and discussed that shows umbilical hernia. Unfortunately the scan did not go more cephalad to visualize epigastric hernia.  Patient's a previous myomectomy , No other abdominal surgeries.    Past Surgical History (Tanisha A. Owens Shark, [place]; 04/11/2019 9:58 AM) No pertinent past surgical history   Diagnostic Studies History (Tanisha A. Owens Shark, [place]; 04/11/2019 9:58 AM) Colonoscopy  1-5 years ago Mammogram  within last year  Allergies (Tanisha A. Owens Shark, [place]; 04/11/2019 9:59 AM) No Known Drug Allergies [04/11/2019]: Allergies Reconciled   Medication History (Tanisha A. Owens Shark, RMA; 04/11/2019 10:00 AM) Desvenlafaxine Succinate ER (100MG  Tablet ER 24HR, Oral) Active. Losartan Potassium-HCTZ (100-12.5MG  Tablet, Oral) Active. Omeprazole (40MG  Capsule DR, Oral) Active. Medications Reconciled  Social History (Tanisha A. Owens Shark, [place]; 04/11/2019 9:59 AM) Caffeine use  Coffee. No drug use  Tobacco use  Never smoker.  Family History (Tanisha A. Owens Shark, [place]; 04/11/2019 9:58 AM) Depression  Mother. Hypertension  Mother.  Pregnancy / Birth History (Tanisha A. Owens Shark, RMA; 04/11/2019 9:59 AM) Age at menarche  75  years. Contraceptive History  Contraceptive implant. Gravida  3  Other Problems (Tanisha A. Owens Shark, [place]; 04/11/2019 9:59 AM) Anxiety Disorder  Depression  Diverticulosis  Gastroesophageal Reflux Disease  High blood pressure     Review of Systems Ralene Ok MD; 04/11/2019 10:42 AM) General Present- Appetite Loss. Not Present- Chills, Fatigue, Fever, Night Sweats, Weight Gain and Weight Loss. HEENT Present- Wears glasses/contact lenses. Not Present- Earache, Hearing Loss, Hoarseness, Nose Bleed, Oral Ulcers, Ringing in the Ears, Seasonal Allergies, Sinus Pain, Sore Throat, Visual Disturbances and Yellow Eyes. Respiratory Present- Snoring. Not Present- Bloody sputum, Chronic Cough, Difficulty Breathing and Wheezing. Breast Present- Nipple Discharge. Not Present- Breast Mass, Breast Pain and Skin Changes. Cardiovascular Present- Leg Cramps and Swelling of Extremities. Not Present- Chest Pain, Difficulty Breathing Lying Down, Palpitations, Rapid Heart Rate and Shortness of Breath. Gastrointestinal Present- Indigestion. Not Present- Abdominal Pain, Bloating, Bloody Stool, Change in Bowel Habits, Chronic diarrhea, Constipation, Difficulty Swallowing, Excessive gas, Gets full quickly at meals, Hemorrhoids, Nausea, Rectal Pain and Vomiting. Female Genitourinary Not Present- Frequency, Nocturia, Painful Urination, Pelvic Pain and Urgency. Musculoskeletal Not Present- Back Pain, Joint Pain, Joint Stiffness, Muscle Pain, Muscle Weakness and Swelling of Extremities. Neurological Present- Headaches, Seizures, Tremor and Weakness. Not Present- Decreased Memory, Fainting, Numbness, Tingling and Trouble walking. Psychiatric Present- Depression. Not Present- Anxiety, Bipolar, Change in Sleep Pattern, Fearful and Frequent crying. Endocrine Present- Cold Intolerance. Not Present- Excessive Hunger, Hair Changes, Heat Intolerance, Hot flashes and New Diabetes. Hematology Not Present- Blood Thinners,  Easy Bruising, Excessive bleeding, Gland problems, HIV and Persistent Infections. All other systems negative  Vitals (Tanisha A. Brown RMA; 04/11/2019 9:59 AM) 04/11/2019 9:59 AM Weight: 203.8 lb Height: 62in Body Surface Area: 1.93 m Body Mass Index: 37.28  kg/m  Temp.: 97.17F  Pulse: 102 (Regular)  BP: 132/82 (Sitting, Left Arm, Standard)       Physical Exam Ralene Ok MD; 04/11/2019 10:44 AM) The physical exam findings are as follows: Note:Constitutional: No acute distress, conversant, appears stated age  Eyes: Anicteric sclerae, moist conjunctiva, no lid lag  Neck: No thyromegaly, trachea midline, no cervical lymphadenopathy  Lungs: Clear to auscultation biilaterally, normal respiratory effot  Cardiovascular: regular rate & rhythm, no murmurs, no peripheal edema, pedal pulses 2+  GI: Soft, no masses or hepatosplenomegaly, non-tender to palpation  MSK: Normal gait, no clubbing cyanosis, edema  Skin: No rashes, palpation reveals normal skin turgor  Psychiatric: Appropriate judgment and insight, oriented to person, place, and time  Abdomen Inspection Hernias - Umbilical hernia - Reducible(Possible second epigastric hernia).    Assessment & Plan Ralene Ok MD; [id number] AM) UMBILICAL HERNIA WITHOUT OBSTRUCTION AND WITHOUT GANGRENE (K42.9) Impression: 48 year old female with an umbilical hernia, possible epigastric hernia. We'll have to visualize this and take down the falciform ligament in the operating room to visualize that there is an epigastric hernia.  1. The patient will like to proceed to the operating room for laparoscopic umbilical and possible epigastric hernia repair with mesh.  2. I discussed with the patient the signs and symptoms of incarceration and strangulation and the need to proceed to the ER should they occur.  3. I discussed with the patient the risks and benefits of the procedure to include but not limited to:  Infection, bleeding, damage to surrounding structures, possible need for further surgery, possible nerve pain, and possible recurrence. The patient was understanding and wishes to proceed.

## 2019-04-11 NOTE — H&P (View-Only) (Signed)
History of Present Illness Ralene Ok MD; 04/11/2019 10:43 AM) The patient is a 48 year old female who presents with an umbilical hernia. Referred by: Dr. cousins Chief Complaint: Umbilical hernia  Patient is a 48 year old female comes in with a history of hypertension, depression, and an umbilical hernia. Patient states that this is been there for approximately 1 year. He states his gotten more painful recently. She states that she would become more active and her job and doing some lifting which is likely cause more for discomfort and pain. She states that it has somewhat gotten somewhat larger. She states that her pain is more in epigastric region versus he umbilical region. Patient did have an MRI in August 2020. He reviewed this personally and discussed that shows umbilical hernia. Unfortunately the scan did not go more cephalad to visualize epigastric hernia.  Patient's a previous myomectomy , No other abdominal surgeries.    Past Surgical History (Tanisha A. Owens Shark, [place]; 04/11/2019 9:58 AM) No pertinent past surgical history   Diagnostic Studies History (Tanisha A. Owens Shark, [place]; 04/11/2019 9:58 AM) Colonoscopy  1-5 years ago Mammogram  within last year  Allergies (Tanisha A. Owens Shark, [place]; 04/11/2019 9:59 AM) No Known Drug Allergies [04/11/2019]: Allergies Reconciled   Medication History (Tanisha A. Owens Shark, RMA; 04/11/2019 10:00 AM) Desvenlafaxine Succinate ER (100MG  Tablet ER 24HR, Oral) Active. Losartan Potassium-HCTZ (100-12.5MG  Tablet, Oral) Active. Omeprazole (40MG  Capsule DR, Oral) Active. Medications Reconciled  Social History (Tanisha A. Owens Shark, [place]; 04/11/2019 9:59 AM) Caffeine use  Coffee. No drug use  Tobacco use  Never smoker.  Family History (Tanisha A. Owens Shark, [place]; 04/11/2019 9:58 AM) Depression  Mother. Hypertension  Mother.  Pregnancy / Birth History (Tanisha A. Owens Shark, RMA; 04/11/2019 9:59 AM) Age at menarche  44  years. Contraceptive History  Contraceptive implant. Gravida  3  Other Problems (Tanisha A. Owens Shark, [place]; 04/11/2019 9:59 AM) Anxiety Disorder  Depression  Diverticulosis  Gastroesophageal Reflux Disease  High blood pressure     Review of Systems Ralene Ok MD; 04/11/2019 10:42 AM) General Present- Appetite Loss. Not Present- Chills, Fatigue, Fever, Night Sweats, Weight Gain and Weight Loss. HEENT Present- Wears glasses/contact lenses. Not Present- Earache, Hearing Loss, Hoarseness, Nose Bleed, Oral Ulcers, Ringing in the Ears, Seasonal Allergies, Sinus Pain, Sore Throat, Visual Disturbances and Yellow Eyes. Respiratory Present- Snoring. Not Present- Bloody sputum, Chronic Cough, Difficulty Breathing and Wheezing. Breast Present- Nipple Discharge. Not Present- Breast Mass, Breast Pain and Skin Changes. Cardiovascular Present- Leg Cramps and Swelling of Extremities. Not Present- Chest Pain, Difficulty Breathing Lying Down, Palpitations, Rapid Heart Rate and Shortness of Breath. Gastrointestinal Present- Indigestion. Not Present- Abdominal Pain, Bloating, Bloody Stool, Change in Bowel Habits, Chronic diarrhea, Constipation, Difficulty Swallowing, Excessive gas, Gets full quickly at meals, Hemorrhoids, Nausea, Rectal Pain and Vomiting. Female Genitourinary Not Present- Frequency, Nocturia, Painful Urination, Pelvic Pain and Urgency. Musculoskeletal Not Present- Back Pain, Joint Pain, Joint Stiffness, Muscle Pain, Muscle Weakness and Swelling of Extremities. Neurological Present- Headaches, Seizures, Tremor and Weakness. Not Present- Decreased Memory, Fainting, Numbness, Tingling and Trouble walking. Psychiatric Present- Depression. Not Present- Anxiety, Bipolar, Change in Sleep Pattern, Fearful and Frequent crying. Endocrine Present- Cold Intolerance. Not Present- Excessive Hunger, Hair Changes, Heat Intolerance, Hot flashes and New Diabetes. Hematology Not Present- Blood Thinners,  Easy Bruising, Excessive bleeding, Gland problems, HIV and Persistent Infections. All other systems negative  Vitals (Tanisha A. Brown RMA; 04/11/2019 9:59 AM) 04/11/2019 9:59 AM Weight: 203.8 lb Height: 62in Body Surface Area: 1.93 m Body Mass Index: 37.28  kg/m  Temp.: 97.34F  Pulse: 102 (Regular)  BP: 132/82 (Sitting, Left Arm, Standard)       Physical Exam Ralene Ok MD; 04/11/2019 10:44 AM) The physical exam findings are as follows: Note:Constitutional: No acute distress, conversant, appears stated age  Eyes: Anicteric sclerae, moist conjunctiva, no lid lag  Neck: No thyromegaly, trachea midline, no cervical lymphadenopathy  Lungs: Clear to auscultation biilaterally, normal respiratory effot  Cardiovascular: regular rate & rhythm, no murmurs, no peripheal edema, pedal pulses 2+  GI: Soft, no masses or hepatosplenomegaly, non-tender to palpation  MSK: Normal gait, no clubbing cyanosis, edema  Skin: No rashes, palpation reveals normal skin turgor  Psychiatric: Appropriate judgment and insight, oriented to person, place, and time  Abdomen Inspection Hernias - Umbilical hernia - Reducible(Possible second epigastric hernia).    Assessment & Plan Ralene Ok MD; [id number] AM) UMBILICAL HERNIA WITHOUT OBSTRUCTION AND WITHOUT GANGRENE (K42.9) Impression: 47 year old female with an umbilical hernia, possible epigastric hernia. We'll have to visualize this and take down the falciform ligament in the operating room to visualize that there is an epigastric hernia.  1. The patient will like to proceed to the operating room for laparoscopic umbilical and possible epigastric hernia repair with mesh.  2. I discussed with the patient the signs and symptoms of incarceration and strangulation and the need to proceed to the ER should they occur.  3. I discussed with the patient the risks and benefits of the procedure to include but not limited to:  Infection, bleeding, damage to surrounding structures, possible need for further surgery, possible nerve pain, and possible recurrence. The patient was understanding and wishes to proceed.

## 2019-04-19 ENCOUNTER — Other Ambulatory Visit (HOSPITAL_COMMUNITY)
Admission: RE | Admit: 2019-04-19 | Discharge: 2019-04-19 | Disposition: A | Discharge status: Home / Self Care | Payer: BC Managed Care – PPO | Source: Ambulatory Visit | Attending: General Surgery | Admitting: General Surgery

## 2019-04-19 DIAGNOSIS — Z20828 Contact with and (suspected) exposure to other viral communicable diseases: Secondary | ICD-10-CM | POA: Insufficient documentation

## 2019-04-19 DIAGNOSIS — Z01812 Encounter for preprocedural laboratory examination: Secondary | ICD-10-CM | POA: Diagnosis not present

## 2019-04-20 LAB — NOVEL CORONAVIRUS, NAA (HOSP ORDER, SEND-OUT TO REF LAB; TAT 18-24 HRS): SARS-CoV-2, NAA: NOT DETECTED

## 2019-04-22 NOTE — Anesthesia Preprocedure Evaluation (Addendum)
Anesthesia Evaluation  Patient identified by MRN, date of birth, ID band Patient awake    Reviewed: Allergy & Precautions, NPO status , Patient's Chart, lab work & pertinent test results  Airway Mallampati: III  TM Distance: >3 FB Neck ROM: Full    Dental  (+) Missing,    Pulmonary neg pulmonary ROS,    Pulmonary exam normal breath sounds clear to auscultation       Cardiovascular hypertension, Pt. on medications Normal cardiovascular exam Rhythm:Regular Rate:Normal     Neuro/Psych PSYCHIATRIC DISORDERS Anxiety Depression negative neurological ROS     GI/Hepatic Neg liver ROS, GERD  Medicated and Controlled,  Endo/Other  negative endocrine ROS  Renal/GU negative Renal ROS     Musculoskeletal negative musculoskeletal ROS (+)   Abdominal (+) + obese,   Peds  Hematology negative hematology ROS (+)   Anesthesia Other Findings UMBILICAL HERNIA POSSIBLE EPIGASTRIC HERNIA  Reproductive/Obstetrics hcg negative                            Anesthesia Physical Anesthesia Plan  ASA: II  Anesthesia Plan: General   Post-op Pain Management:    Induction: Intravenous  PONV Risk Score and Plan: 4 or greater and Ondansetron, Dexamethasone, Midazolam, Treatment may vary due to age or medical condition and Scopolamine patch - Pre-op  Airway Management Planned: Oral ETT  Additional Equipment:   Intra-op Plan:   Post-operative Plan: Extubation in OR  Informed Consent: I have reviewed the patients History and Physical, chart, labs and discussed the procedure including the risks, benefits and alternatives for the proposed anesthesia with the patient or authorized representative who has indicated his/her understanding and acceptance.     Dental advisory given  Plan Discussed with: CRNA  Anesthesia Plan Comments:        Anesthesia Quick Evaluation

## 2019-04-22 NOTE — Progress Notes (Signed)
Unable to reach pt by phone for pre-op call. Left detailed pre-op instructions on pt's voicemail

## 2019-04-23 ENCOUNTER — Encounter (HOSPITAL_COMMUNITY)
Admission: RE | Disposition: A | Discharge status: Home / Self Care | Payer: Self-pay | Source: Home / Self Care | Attending: General Surgery

## 2019-04-23 ENCOUNTER — Encounter (HOSPITAL_COMMUNITY): Payer: Self-pay | Admitting: *Deleted

## 2019-04-23 ENCOUNTER — Other Ambulatory Visit: Payer: Self-pay

## 2019-04-23 ENCOUNTER — Ambulatory Visit (HOSPITAL_COMMUNITY): Payer: BC Managed Care – PPO | Admitting: Anesthesiology

## 2019-04-23 ENCOUNTER — Ambulatory Visit (HOSPITAL_COMMUNITY)
Admission: RE | Admit: 2019-04-23 | Discharge: 2019-04-23 | Disposition: A | Discharge status: Home / Self Care | Payer: BC Managed Care – PPO | Attending: General Surgery | Admitting: General Surgery

## 2019-04-23 DIAGNOSIS — F419 Anxiety disorder, unspecified: Secondary | ICD-10-CM | POA: Insufficient documentation

## 2019-04-23 DIAGNOSIS — K429 Umbilical hernia without obstruction or gangrene: Secondary | ICD-10-CM | POA: Diagnosis not present

## 2019-04-23 DIAGNOSIS — I10 Essential (primary) hypertension: Secondary | ICD-10-CM | POA: Diagnosis not present

## 2019-04-23 DIAGNOSIS — F418 Other specified anxiety disorders: Secondary | ICD-10-CM | POA: Diagnosis not present

## 2019-04-23 DIAGNOSIS — F329 Major depressive disorder, single episode, unspecified: Secondary | ICD-10-CM | POA: Insufficient documentation

## 2019-04-23 DIAGNOSIS — K219 Gastro-esophageal reflux disease without esophagitis: Secondary | ICD-10-CM | POA: Diagnosis not present

## 2019-04-23 DIAGNOSIS — Z79899 Other long term (current) drug therapy: Secondary | ICD-10-CM | POA: Diagnosis not present

## 2019-04-23 HISTORY — PX: UMBILICAL HERNIA REPAIR: SHX196

## 2019-04-23 HISTORY — DX: Depression, unspecified: F32.A

## 2019-04-23 LAB — POCT I-STAT, CHEM 8
BUN: 23 mg/dL — ABNORMAL HIGH (ref 6–20)
Calcium, Ion: 1.19 mmol/L (ref 1.15–1.40)
Chloride: 103 mmol/L (ref 98–111)
Creatinine, Ser: 1.1 mg/dL — ABNORMAL HIGH (ref 0.44–1.00)
Glucose, Bld: 111 mg/dL — ABNORMAL HIGH (ref 70–99)
HCT: 38 % (ref 36.0–46.0)
Hemoglobin: 12.9 g/dL (ref 12.0–15.0)
Potassium: 4.9 mmol/L (ref 3.5–5.1)
Sodium: 140 mmol/L (ref 135–145)
TCO2: 28 mmol/L (ref 22–32)

## 2019-04-23 LAB — POCT PREGNANCY, URINE: Preg Test, Ur: NEGATIVE

## 2019-04-23 SURGERY — REPAIR, HERNIA, UMBILICAL, LAPAROSCOPIC
Anesthesia: General | Site: Abdomen

## 2019-04-23 MED ORDER — OXYCODONE HCL 5 MG/5ML PO SOLN: 5.0000 mg | Freq: Once | ORAL | Status: AC | PRN

## 2019-04-23 MED ORDER — BUPIVACAINE HCL 0.25 % IJ SOLN
INTRAMUSCULAR | Status: DC | PRN
  Administered 2019-04-23: 15 mL

## 2019-04-23 MED ORDER — TRAMADOL HCL 50 MG PO TABS: 50.0000 mg | ORAL_TABLET | Freq: Four times a day (QID) | ORAL | 0 refills | Status: AC | PRN

## 2019-04-23 MED ORDER — 0.9 % SODIUM CHLORIDE (POUR BTL) OPTIME
TOPICAL | Status: DC | PRN
  Administered 2019-04-23: 09:00:00 1000 mL

## 2019-04-23 MED ORDER — ACETAMINOPHEN 500 MG PO TABS
1000.0000 mg | ORAL_TABLET | ORAL | Status: AC
  Administered 2019-04-23: 1000 mg via ORAL
  Filled 2019-04-23: qty 2

## 2019-04-23 MED ORDER — FENTANYL CITRATE (PF) 250 MCG/5ML IJ SOLN
INTRAMUSCULAR | Status: DC | PRN
  Administered 2019-04-23: 50 ug via INTRAVENOUS
  Administered 2019-04-23: 100 ug via INTRAVENOUS

## 2019-04-23 MED ORDER — LACTATED RINGERS IV SOLN
INTRAVENOUS | Status: DC | PRN
  Administered 2019-04-23: 07:00:00 via INTRAVENOUS

## 2019-04-23 MED ORDER — LIDOCAINE 2% (20 MG/ML) 5 ML SYRINGE
INTRAMUSCULAR | Status: DC | PRN
  Administered 2019-04-23: 100 mg via INTRAVENOUS

## 2019-04-23 MED ORDER — PROMETHAZINE HCL 25 MG/ML IJ SOLN
INTRAMUSCULAR | Status: AC
  Administered 2019-04-23: 6.25 mg via INTRAVENOUS
  Filled 2019-04-23: qty 1

## 2019-04-23 MED ORDER — PHENYLEPHRINE 40 MCG/ML (10ML) SYRINGE FOR IV PUSH (FOR BLOOD PRESSURE SUPPORT)
PREFILLED_SYRINGE | INTRAVENOUS | Status: DC | PRN
  Administered 2019-04-23 (×2): 80 ug via INTRAVENOUS

## 2019-04-23 MED ORDER — PROPOFOL 10 MG/ML IV BOLUS
INTRAVENOUS | Status: AC
  Filled 2019-04-23: qty 20

## 2019-04-23 MED ORDER — HYDROMORPHONE HCL 1 MG/ML IJ SOLN
0.2500 mg | INTRAMUSCULAR | Status: DC | PRN
  Administered 2019-04-23: 0.25 mg via INTRAVENOUS

## 2019-04-23 MED ORDER — SCOPOLAMINE 1 MG/3DAYS TD PT72
MEDICATED_PATCH | TRANSDERMAL | Status: AC
  Administered 2019-04-23: 1.5 mg via TRANSDERMAL
  Filled 2019-04-23: qty 1

## 2019-04-23 MED ORDER — CHLORHEXIDINE GLUCONATE CLOTH 2 % EX PADS: 6.0000 | MEDICATED_PAD | Freq: Once | CUTANEOUS | Status: DC

## 2019-04-23 MED ORDER — SUGAMMADEX SODIUM 200 MG/2ML IV SOLN
INTRAVENOUS | Status: DC | PRN
  Administered 2019-04-23: 200 mg via INTRAVENOUS

## 2019-04-23 MED ORDER — MIDAZOLAM HCL 2 MG/2ML IJ SOLN
INTRAMUSCULAR | Status: AC
  Filled 2019-04-23: qty 2

## 2019-04-23 MED ORDER — PROMETHAZINE HCL 25 MG/ML IJ SOLN
6.2500 mg | INTRAMUSCULAR | Status: DC | PRN
  Administered 2019-04-23: 10:00:00 6.25 mg via INTRAVENOUS

## 2019-04-23 MED ORDER — CEFAZOLIN SODIUM-DEXTROSE 2-4 GM/100ML-% IV SOLN
2.0000 g | INTRAVENOUS | Status: AC
  Administered 2019-04-23: 2 g via INTRAVENOUS
  Filled 2019-04-23: qty 100

## 2019-04-23 MED ORDER — HYDROMORPHONE HCL 1 MG/ML IJ SOLN
INTRAMUSCULAR | Status: AC
  Filled 2019-04-23: qty 1

## 2019-04-23 MED ORDER — SCOPOLAMINE 1 MG/3DAYS TD PT72
1.0000 | MEDICATED_PATCH | TRANSDERMAL | Status: DC
  Administered 2019-04-23: 08:00:00 1.5 mg via TRANSDERMAL

## 2019-04-23 MED ORDER — DEXAMETHASONE SODIUM PHOSPHATE 10 MG/ML IJ SOLN
INTRAMUSCULAR | Status: DC | PRN
  Administered 2019-04-23: 10 mg via INTRAVENOUS

## 2019-04-23 MED ORDER — OXYCODONE HCL 5 MG PO TABS
5.0000 mg | ORAL_TABLET | Freq: Once | ORAL | Status: AC | PRN
  Administered 2019-04-23: 5 mg via ORAL

## 2019-04-23 MED ORDER — FENTANYL CITRATE (PF) 250 MCG/5ML IJ SOLN
INTRAMUSCULAR | Status: AC
  Filled 2019-04-23: qty 5

## 2019-04-23 MED ORDER — ONDANSETRON HCL 4 MG/2ML IJ SOLN
INTRAMUSCULAR | Status: DC | PRN
  Administered 2019-04-23 (×2): 4 mg via INTRAVENOUS

## 2019-04-23 MED ORDER — OXYCODONE HCL 5 MG PO TABS
ORAL_TABLET | ORAL | Status: AC
  Filled 2019-04-23: qty 1

## 2019-04-23 MED ORDER — MIDAZOLAM HCL 2 MG/2ML IJ SOLN
INTRAMUSCULAR | Status: DC | PRN
  Administered 2019-04-23: 2 mg via INTRAVENOUS

## 2019-04-23 MED ORDER — PROPOFOL 10 MG/ML IV BOLUS
INTRAVENOUS | Status: DC | PRN
  Administered 2019-04-23: 150 mg via INTRAVENOUS
  Administered 2019-04-23: 50 mg via INTRAVENOUS

## 2019-04-23 MED ORDER — CELECOXIB 200 MG PO CAPS
200.0000 mg | ORAL_CAPSULE | ORAL | Status: AC
  Administered 2019-04-23: 07:00:00 200 mg via ORAL
  Filled 2019-04-23: qty 1

## 2019-04-23 MED ORDER — ROCURONIUM BROMIDE 10 MG/ML (PF) SYRINGE
PREFILLED_SYRINGE | INTRAVENOUS | Status: DC | PRN
  Administered 2019-04-23: 70 mg via INTRAVENOUS

## 2019-04-23 SURGICAL SUPPLY — 40 items
BLADE CLIPPER SURG (BLADE) ×3 IMPLANT
CANISTER SUCT 3000ML PPV (MISCELLANEOUS) IMPLANT
CHLORAPREP W/TINT 26 (MISCELLANEOUS) ×3 IMPLANT
COVER SURGICAL LIGHT HANDLE (MISCELLANEOUS) ×3 IMPLANT
COVER WAND RF STERILE (DRAPES) ×3 IMPLANT
DERMABOND ADVANCED (GAUZE/BANDAGES/DRESSINGS) ×2
DERMABOND ADVANCED .7 DNX12 (GAUZE/BANDAGES/DRESSINGS) ×1 IMPLANT
DEVICE SECURE STRAP 25 ABSORB (INSTRUMENTS) ×3 IMPLANT
DEVICE TROCAR PUNCTURE CLOSURE (ENDOMECHANICALS) ×3 IMPLANT
ELECT REM PT RETURN 9FT ADLT (ELECTROSURGICAL) ×3
ELECTRODE REM PT RTRN 9FT ADLT (ELECTROSURGICAL) ×1 IMPLANT
GLOVE BIO SURGEON STRL SZ7.5 (GLOVE) ×3 IMPLANT
GOWN STRL REUS W/ TWL LRG LVL3 (GOWN DISPOSABLE) ×2 IMPLANT
GOWN STRL REUS W/ TWL XL LVL3 (GOWN DISPOSABLE) ×1 IMPLANT
GOWN STRL REUS W/TWL LRG LVL3 (GOWN DISPOSABLE) ×4
GOWN STRL REUS W/TWL XL LVL3 (GOWN DISPOSABLE) ×2
KIT BASIN OR (CUSTOM PROCEDURE TRAY) ×3 IMPLANT
KIT TURNOVER KIT B (KITS) ×3 IMPLANT
MARKER SKIN DUAL TIP RULER LAB (MISCELLANEOUS) ×3 IMPLANT
MESH VENTRALIGHT ST 4.5IN (Mesh General) ×3 IMPLANT
NEEDLE INSUFFLATION 14GA 120MM (NEEDLE) ×3 IMPLANT
NEEDLE SPNL 22GX3.5 QUINCKE BK (NEEDLE) IMPLANT
NS IRRIG 1000ML POUR BTL (IV SOLUTION) ×3 IMPLANT
PAD ARMBOARD 7.5X6 YLW CONV (MISCELLANEOUS) ×6 IMPLANT
SCISSORS LAP 5X35 DISP (ENDOMECHANICALS) ×3 IMPLANT
SET IRRIG TUBING LAPAROSCOPIC (IRRIGATION / IRRIGATOR) IMPLANT
SET TUBE SMOKE EVAC HIGH FLOW (TUBING) ×3 IMPLANT
SLEEVE ENDOPATH XCEL 5M (ENDOMECHANICALS) ×3 IMPLANT
SUT CHROMIC 2 0 SH (SUTURE) ×3 IMPLANT
SUT ETHIBOND 0 MO6 C/R (SUTURE) ×3 IMPLANT
SUT MNCRL AB 4-0 PS2 18 (SUTURE) ×3 IMPLANT
SUT NOVA 1 T20/GS 25DT (SUTURE) IMPLANT
SUT PROLENE 2 0 KS (SUTURE) ×6 IMPLANT
TOWEL GREEN STERILE (TOWEL DISPOSABLE) ×3 IMPLANT
TOWEL GREEN STERILE FF (TOWEL DISPOSABLE) ×3 IMPLANT
TRAY LAPAROSCOPIC MC (CUSTOM PROCEDURE TRAY) ×3 IMPLANT
TROCAR XCEL BLUNT TIP 100MML (ENDOMECHANICALS) IMPLANT
TROCAR XCEL NON-BLD 11X100MML (ENDOMECHANICALS) IMPLANT
TROCAR XCEL NON-BLD 5MMX100MML (ENDOMECHANICALS) ×3 IMPLANT
WATER STERILE IRR 1000ML POUR (IV SOLUTION) ×3 IMPLANT

## 2019-04-23 NOTE — Op Note (Signed)
04/23/2019  9:13 AM  PATIENT:  Annette Diaz  48 y.o. female  PRE-OPERATIVE DIAGNOSIS:  UMBILICAL HERNIA  POST-OPERATIVE DIAGNOSIS:  UMBILICAL HERNIA   PROCEDURE:  Procedure(s): LAPAROSCOPIC UMBILICAL HERNIA REPAIR  WITH MESH (N/A)  SURGEON:  Surgeon(s) and Role:    * Ralene Ok, MD - Primary  ANESTHESIA:   local and general  EBL:  5 mL   BLOOD ADMINISTERED:none  DRAINS: none   LOCAL MEDICATIONS USED:  BUPIVICAINE   SPECIMEN:  No Specimen  DISPOSITION OF SPECIMEN:  N/A  COUNTS:  YES  TOURNIQUET:  * No tourniquets in log *  DICTATION: .Dragon Dictation Findings: 1cm UH and no epigastric hernia seen.   Details of the procedure:   After the patient was consented patient was taken back to the operating room patient was then placed in supine position bilateral SCDs in place.  The patient was prepped and draped in the usual sterile fashion. After antibiotics were confirmed a timeout was called and all facts were verified. The Veress needle technique was used to insuflate the abdomen at Palmer's point. The abdomen was insufflated to 14 mm mercury. Subsequently a 5 mm trocar was placed a camera inserted there was no injury to any intra-abdominal organs.    There was seen to be an non-incarcerated  1cm umbilical hernia.  A second camera port was in placed into the left lower quadrant.   At this the Falicform ligament was taken down with Bovie cautery maintaining hemostasis.  There was no epigastric hernia seen.  I proceeded to reduce the hernia contents.  The hernia sac was dissected out of the hernia and disposed.  The fascia at the hernia was reapproximated using a #1Ethibond x 3.  Once the hernia was cleared away, a Bard Ventralight 11.4cm  mesh was inserted into the abdomen.  The mesh was secured circumferentially with am Securestrap tacker in a double crown fashion.  2-0 prolenes were used at 3:00, 6:00, 9:00, and 12:00 as transfascial sutures using a endoclose  decive.    The omentum was brought over the area of the mesh. The pneumoperitoneum was evacuated  & all trocars  were removed. The skin was reapproximated with 4-0  Monocryl sutures in a subcuticular fashion. The skin was dressed with Dermabond.  The patient was taken to the recovery room in stable condition.  Type of repair -primary suture & mesh  Mesh overlap - 4cm  Placement of mesh -  beneath fascia and into peritoneal cavity   PLAN OF CARE: Discharge to home after PACU  PATIENT DISPOSITION:  PACU - hemodynamically stable.   Delay start of Pharmacological VTE agent (>24hrs) due to surgical blood loss or risk of bleeding: not applicable

## 2019-04-23 NOTE — Anesthesia Procedure Notes (Signed)
Procedure Name: Intubation Date/Time: 04/23/2019 8:48 AM Performed by: Larene Beach, CRNA Pre-anesthesia Checklist: Patient identified, Emergency Drugs available, Suction available and Patient being monitored Patient Re-evaluated:Patient Re-evaluated prior to induction Oxygen Delivery Method: Circle system utilized Preoxygenation: Pre-oxygenation with 100% oxygen Induction Type: IV induction Ventilation: Mask ventilation without difficulty Laryngoscope Size: Mac and 4 Grade View: Grade I Tube type: Oral Tube size: 7.0 mm Number of attempts: 1 Airway Equipment and Method: Stylet Placement Confirmation: ETT inserted through vocal cords under direct vision,  positive ETCO2 and breath sounds checked- equal and bilateral Secured at: 21 cm Tube secured with: Tape Dental Injury: Teeth and Oropharynx as per pre-operative assessment

## 2019-04-23 NOTE — Interval H&P Note (Signed)
History and Physical Interval Note:  04/23/2019 7:41 AM  Annette Diaz  has presented today for surgery, with the diagnosis of UMBILICAL HERNIA POSSIBLE EPIGASTRIC HERNIA.  The various methods of treatment have been discussed with the patient and family. After consideration of risks, benefits and other options for treatment, the patient has consented to  Procedure(s): [place] (N/A) as a surgical intervention.  The patient's history has been reviewed, patient examined, no change in status, stable for surgery.  I have reviewed the patient's chart and labs.  Questions were answered to the patient's satisfaction.     Ralene Ok

## 2019-04-23 NOTE — Transfer of Care (Signed)
Immediate Anesthesia Transfer of Care Note  Patient: Metallurgist  Procedure(s) Performed: LAPAROSCOPIC UMBILICAL HERNIA REPAIR  WITH MESH (N/A Abdomen)  Patient Location: PACU  Anesthesia Type:General  Level of Consciousness: drowsy and patient cooperative  Airway & Oxygen Therapy: Patient Spontanous Breathing  Post-op Assessment: Report given to RN, Post -op Vital signs reviewed and stable and Patient moving all extremities X 4  Post vital signs: Reviewed and stable  Last Vitals:  Vitals Value Taken Time  BP    Temp    Pulse    Resp 19 04/23/19 0924  SpO2    Vitals shown include unvalidated device data.  Last Pain:  Vitals:   04/23/19 0729  TempSrc:   PainSc: 0-No pain      Patients Stated Pain Goal: 6 ([id number])  Complications: No apparent anesthesia complications

## 2019-04-23 NOTE — Discharge Instructions (Signed)
[facility] _______[facility] ° °HERNIA REPAIR: POST OP INSTRUCTIONS ° °Always review your discharge instruction sheet given to you by the facility where your surgery was performed. °IF YOU HAVE DISABILITY OR FAMILY LEAVE FORMS, YOU MUST BRING THEM TO THE OFFICE FOR PROCESSING.   °DO NOT GIVE THEM TO YOUR DOCTOR. ° °1. A  prescription for pain medication may be given to you upon discharge.  Take your pain medication as prescribed, if needed.  If narcotic pain medicine is not needed, then you may take acetaminophen (Tylenol) or ibuprofen (Advil) as needed. °2. Take your usually prescribed medications unless otherwise directed. °If you need a refill on your pain medication, please contact your pharmacy.  They will contact our office to request authorization. Prescriptions will not be filled after 5 pm or on week-ends. °3. You should follow a light diet the first 24 hours after arrival home, such as soup and crackers, etc.  Be sure to include lots of fluids daily.  Resume your normal diet the day after surgery. °4.Most patients will experience some swelling and bruising around the umbilicus or in the groin and scrotum.  Ice packs and reclining will help.  Swelling and bruising can take several days to resolve.  °6. It is common to experience some constipation if taking pain medication after surgery.  Increasing fluid intake and taking a stool softener (such as Colace) will usually help or prevent this problem from occurring.  A mild laxative (Milk of Magnesia or Miralax) should be taken according to package directions if there are no bowel movements after 48 hours. °7. Unless discharge instructions indicate otherwise, you may remove your bandages 24-48 hours after surgery, and you may shower at that time.  You may have steri-strips (small skin tapes) in place directly over the incision.  These strips should be left on the skin for 7-10 days.  If your surgeon used skin glue on the incision, you may shower in  24 hours.  The glue will flake off over the next 2-3 weeks.  Any sutures or staples will be removed at the office during your follow-up visit. °8. ACTIVITIES:  You may resume regular (light) daily activities beginning the next day--such as daily self-care, walking, climbing stairs--gradually increasing activities as tolerated.  You may have sexual intercourse when it is comfortable.  Refrain from any heavy lifting or straining until approved by your doctor. ° °a.You may drive when you are no longer taking prescription pain medication, you can comfortably wear a seatbelt, and you can safely maneuver your car and apply brakes. °b.RETURN TO WORK:   °_____________________________________________ ° °9.You should see your doctor in the office for a follow-up appointment approximately 2-3 weeks after your surgery.  Make sure that you call for this appointment within a day or two after you arrive home to insure a convenient appointment time. °10.OTHER INSTRUCTIONS: _________________________ °   _____________________________________ ° °WHEN TO CALL YOUR DOCTOR: °1. Fever over 101.0 °2. Inability to urinate °3. Nausea and/or vomiting °4. Extreme swelling or bruising °5. Continued bleeding from incision. °6. Increased pain, redness, or drainage from the incision ° °The clinic staff is available to answer your questions during regular business hours.  Please don’t hesitate to call and ask to speak to one of the nurses for clinical concerns.  If you have a medical emergency, go to the nearest emergency room or call 911.  A surgeon from [facility] is always on call at the hospital ° ° °[address], [address], [place], [place]  [address] ? ° [address] [address], [place], [place]   [address] °[phone] ? [phone] ? FAX [phone] °Web site: [email or web address] ° °

## 2019-04-23 NOTE — Anesthesia Postprocedure Evaluation (Signed)
Anesthesia Post Note  Patient: Metallurgist  Procedure(s) Performed: LAPAROSCOPIC UMBILICAL HERNIA REPAIR  WITH MESH (N/A Abdomen)     Patient location during evaluation: PACU Anesthesia Type: General Level of consciousness: awake and alert Pain management: pain level controlled Vital Signs Assessment: post-procedure vital signs reviewed and stable Respiratory status: spontaneous breathing, nonlabored ventilation, respiratory function stable and patient connected to nasal cannula oxygen Cardiovascular status: blood pressure returned to baseline and stable Postop Assessment: no apparent nausea or vomiting Anesthetic complications: no    Last Vitals:  Vitals:   04/23/19 0945 04/23/19 1000  BP: 131/89 (!) 134/91  Pulse: 77 72  Resp: 14 17  Temp:  36.5 C  SpO2: 99% 100%    Last Pain:  Vitals:   04/23/19 1000  TempSrc:   PainSc: 5                  Brittay Mogle P Spenser Harren

## 2019-04-24 ENCOUNTER — Encounter (HOSPITAL_COMMUNITY): Payer: Self-pay | Admitting: General Surgery

## 2019-05-29 ENCOUNTER — Other Ambulatory Visit (HOSPITAL_COMMUNITY)
Admission: RE | Admit: 2019-05-29 | Discharge: 2019-05-29 | Disposition: A | Discharge status: Home / Self Care | Payer: BC Managed Care – PPO | Source: Ambulatory Visit | Attending: Interventional Radiology | Admitting: Interventional Radiology

## 2019-05-29 DIAGNOSIS — Z20828 Contact with and (suspected) exposure to other viral communicable diseases: Secondary | ICD-10-CM | POA: Insufficient documentation

## 2019-05-29 DIAGNOSIS — Z01812 Encounter for preprocedural laboratory examination: Secondary | ICD-10-CM | POA: Insufficient documentation

## 2019-05-30 LAB — NOVEL CORONAVIRUS, NAA (HOSP ORDER, SEND-OUT TO REF LAB; TAT 18-24 HRS): SARS-CoV-2, NAA: NOT DETECTED

## 2019-06-02 ENCOUNTER — Inpatient Hospital Stay (HOSPITAL_COMMUNITY): Admission: RE | Admit: 2019-06-02 | Payer: BC Managed Care – PPO | Source: Ambulatory Visit

## 2019-06-02 ENCOUNTER — Ambulatory Visit (HOSPITAL_COMMUNITY): Payer: BC Managed Care – PPO

## 2019-10-14 DIAGNOSIS — R1032 Left lower quadrant pain: Secondary | ICD-10-CM | POA: Diagnosis not present

## 2019-10-14 DIAGNOSIS — D251 Intramural leiomyoma of uterus: Secondary | ICD-10-CM | POA: Diagnosis not present

## 2019-11-26 DIAGNOSIS — M6208 Separation of muscle (nontraumatic), other site: Secondary | ICD-10-CM | POA: Diagnosis not present

## 2019-12-08 DIAGNOSIS — E669 Obesity, unspecified: Secondary | ICD-10-CM | POA: Diagnosis not present

## 2019-12-08 DIAGNOSIS — K219 Gastro-esophageal reflux disease without esophagitis: Secondary | ICD-10-CM | POA: Diagnosis not present

## 2019-12-08 DIAGNOSIS — I10 Essential (primary) hypertension: Secondary | ICD-10-CM | POA: Diagnosis not present

## 2019-12-12 DIAGNOSIS — N361 Urethral diverticulum: Secondary | ICD-10-CM | POA: Diagnosis not present

## 2019-12-12 DIAGNOSIS — N3000 Acute cystitis without hematuria: Secondary | ICD-10-CM | POA: Diagnosis not present

## 2019-12-15 DIAGNOSIS — F3342 Major depressive disorder, recurrent, in full remission: Secondary | ICD-10-CM | POA: Diagnosis not present

## 2019-12-15 DIAGNOSIS — F410 Panic disorder [episodic paroxysmal anxiety] without agoraphobia: Secondary | ICD-10-CM | POA: Diagnosis not present

## 2019-12-24 DIAGNOSIS — Z800 Family history of malignant neoplasm of digestive organs: Secondary | ICD-10-CM | POA: Diagnosis not present

## 2019-12-24 DIAGNOSIS — K219 Gastro-esophageal reflux disease without esophagitis: Secondary | ICD-10-CM | POA: Diagnosis not present

## 2019-12-24 DIAGNOSIS — K5900 Constipation, unspecified: Secondary | ICD-10-CM | POA: Diagnosis not present

## 2020-02-11 ENCOUNTER — Other Ambulatory Visit: Payer: Self-pay

## 2020-02-11 ENCOUNTER — Other Ambulatory Visit: Payer: BC Managed Care – PPO

## 2020-02-11 DIAGNOSIS — Z20822 Contact with and (suspected) exposure to covid-19: Secondary | ICD-10-CM

## 2020-02-12 LAB — NOVEL CORONAVIRUS, NAA: SARS-CoV-2, NAA: NOT DETECTED

## 2020-03-16 DIAGNOSIS — F331 Major depressive disorder, recurrent, moderate: Secondary | ICD-10-CM | POA: Diagnosis not present

## 2020-03-16 DIAGNOSIS — F4321 Adjustment disorder with depressed mood: Secondary | ICD-10-CM | POA: Diagnosis not present

## 2020-04-08 ENCOUNTER — Other Ambulatory Visit (HOSPITAL_COMMUNITY): Payer: Self-pay | Admitting: Interventional Radiology

## 2020-04-08 DIAGNOSIS — D259 Leiomyoma of uterus, unspecified: Secondary | ICD-10-CM

## 2020-04-09 DIAGNOSIS — F341 Dysthymic disorder: Secondary | ICD-10-CM | POA: Diagnosis not present

## 2020-04-09 DIAGNOSIS — F4310 Post-traumatic stress disorder, unspecified: Secondary | ICD-10-CM | POA: Diagnosis not present

## 2020-04-09 DIAGNOSIS — F4321 Adjustment disorder with depressed mood: Secondary | ICD-10-CM | POA: Diagnosis not present

## 2020-04-12 DIAGNOSIS — F4323 Adjustment disorder with mixed anxiety and depressed mood: Secondary | ICD-10-CM | POA: Diagnosis not present

## 2020-05-03 DIAGNOSIS — F4323 Adjustment disorder with mixed anxiety and depressed mood: Secondary | ICD-10-CM | POA: Diagnosis not present

## 2020-05-10 DIAGNOSIS — F4323 Adjustment disorder with mixed anxiety and depressed mood: Secondary | ICD-10-CM | POA: Diagnosis not present

## 2020-05-17 DIAGNOSIS — F4323 Adjustment disorder with mixed anxiety and depressed mood: Secondary | ICD-10-CM | POA: Diagnosis not present

## 2020-05-19 DIAGNOSIS — F4323 Adjustment disorder with mixed anxiety and depressed mood: Secondary | ICD-10-CM | POA: Diagnosis not present

## 2020-05-20 DIAGNOSIS — Z1159 Encounter for screening for other viral diseases: Secondary | ICD-10-CM | POA: Diagnosis not present

## 2020-05-24 DIAGNOSIS — F4323 Adjustment disorder with mixed anxiety and depressed mood: Secondary | ICD-10-CM | POA: Diagnosis not present

## 2020-05-25 DIAGNOSIS — Z1211 Encounter for screening for malignant neoplasm of colon: Secondary | ICD-10-CM | POA: Diagnosis not present

## 2020-05-25 DIAGNOSIS — Z800 Family history of malignant neoplasm of digestive organs: Secondary | ICD-10-CM | POA: Diagnosis not present

## 2020-05-27 DIAGNOSIS — F4323 Adjustment disorder with mixed anxiety and depressed mood: Secondary | ICD-10-CM | POA: Diagnosis not present

## 2020-06-02 DIAGNOSIS — I10 Essential (primary) hypertension: Secondary | ICD-10-CM | POA: Diagnosis not present

## 2020-06-02 DIAGNOSIS — Z01419 Encounter for gynecological examination (general) (routine) without abnormal findings: Secondary | ICD-10-CM | POA: Diagnosis not present

## 2020-06-02 DIAGNOSIS — Z9889 Other specified postprocedural states: Secondary | ICD-10-CM | POA: Diagnosis not present

## 2020-06-02 DIAGNOSIS — R300 Dysuria: Secondary | ICD-10-CM | POA: Diagnosis not present

## 2020-06-02 DIAGNOSIS — F331 Major depressive disorder, recurrent, moderate: Secondary | ICD-10-CM | POA: Diagnosis not present

## 2020-06-02 DIAGNOSIS — F4310 Post-traumatic stress disorder, unspecified: Secondary | ICD-10-CM | POA: Diagnosis not present

## 2020-06-22 DIAGNOSIS — E669 Obesity, unspecified: Secondary | ICD-10-CM | POA: Diagnosis not present

## 2020-06-22 DIAGNOSIS — F32A Depression, unspecified: Secondary | ICD-10-CM | POA: Diagnosis not present

## 2020-06-22 DIAGNOSIS — F419 Anxiety disorder, unspecified: Secondary | ICD-10-CM | POA: Diagnosis not present

## 2020-06-22 DIAGNOSIS — I10 Essential (primary) hypertension: Secondary | ICD-10-CM | POA: Diagnosis not present

## 2020-06-25 DIAGNOSIS — F410 Panic disorder [episodic paroxysmal anxiety] without agoraphobia: Secondary | ICD-10-CM | POA: Diagnosis not present

## 2020-06-25 DIAGNOSIS — F3341 Major depressive disorder, recurrent, in partial remission: Secondary | ICD-10-CM | POA: Diagnosis not present

## 2020-11-23 IMAGING — MR MRI PELVIS WITHOUT AND WITH CONTRAST
11 of 14 series · 37 of 48 positions shown · IV contrast (multihance)
Comparison: 09/03/2013

CLINICAL DATA: Symptomatic fibroids. Previous myomectomy.
Evaluation for uterine artery embolization.

Creatinine was obtained on site at [HOSPITAL] at [HOSPITAL].
Results: Creatinine 1.0 mg/dL.
EXAM:
MRI PELVIS WITHOUT AND WITH CONTRAST
TECHNIQUE: Multiplanar multisequence MR imaging of the pelvis was performed
both before and after administration of intravenous contrast.
CONTRAST:  20mL MULTIHANCE GADOBENATE DIMEGLUMINE 529 MG/ML IV SOLN

[Series 3: cor haste cover · coronal · 6.0mm · 0.74mm/px · 4 of 32 slices shown]
[im 1/32]
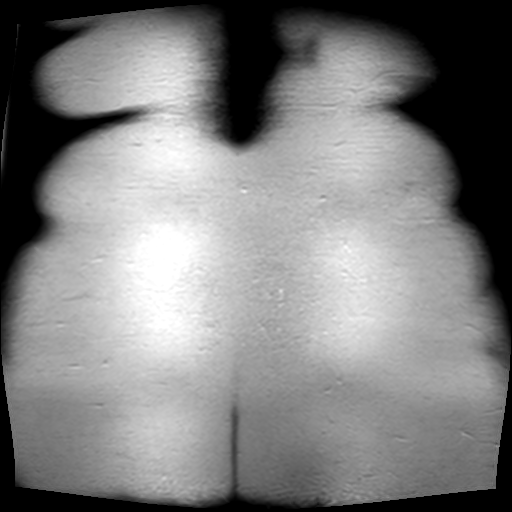
[im 11/32]
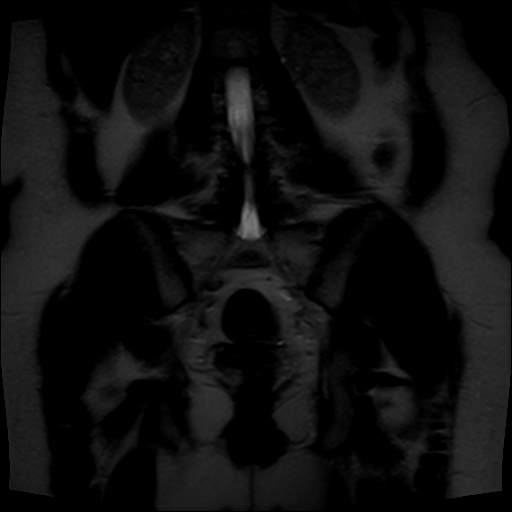
[im 21/32]
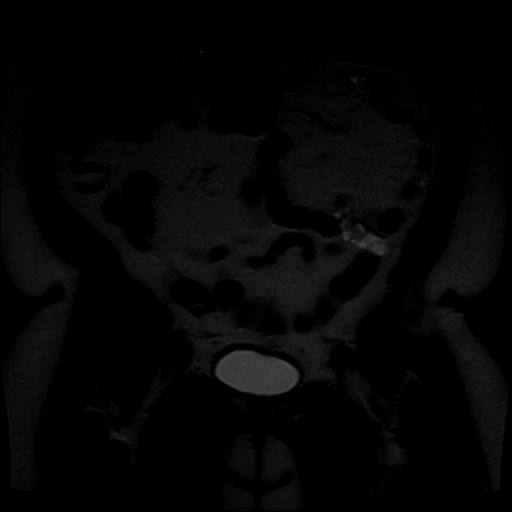
[im 32/32]
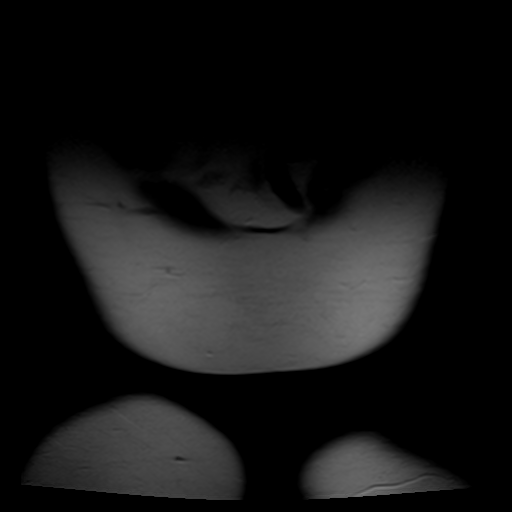

[Series 4: t2_tse_sag · sagittal · 5.0mm · 1.09mm/px · 4 of 30 slices shown]
[im 1/30]
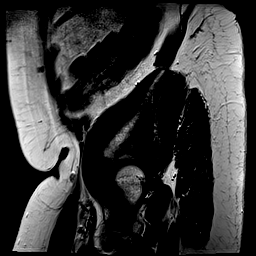
[im 10/30]
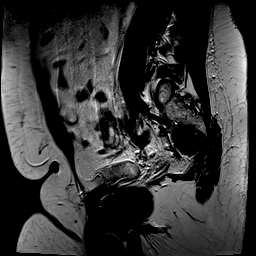
[im 20/30]
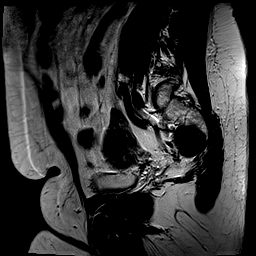
[im 30/30]
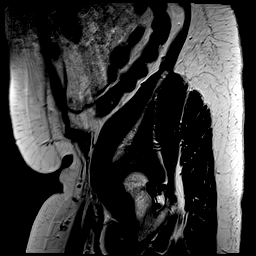

[Series 5: t2_tse axial · axial · 7.0mm · 0.55mm/px · z∈[-70,+148]mm · 3 of 25 slices shown]
[im 1/25]
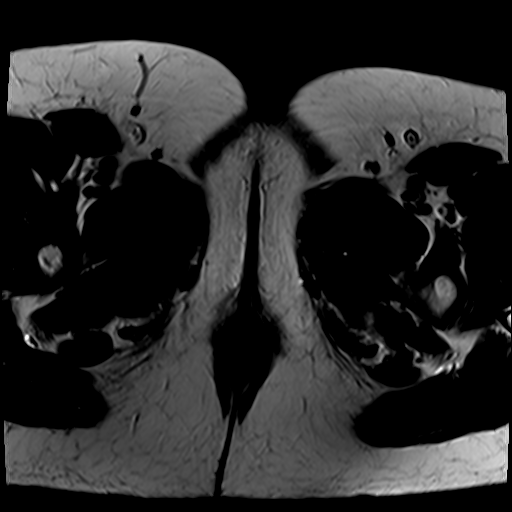
[im 13/25]
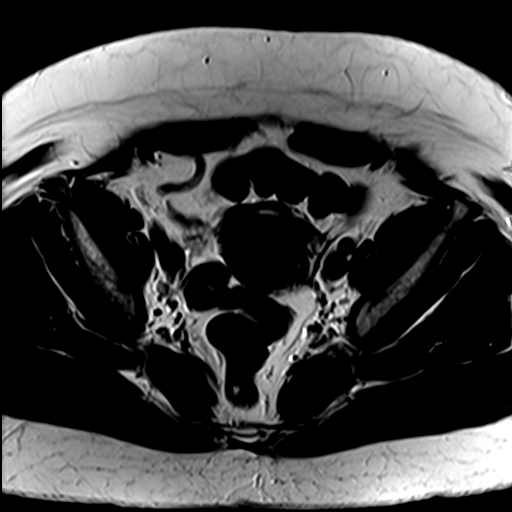
[im 25/25]
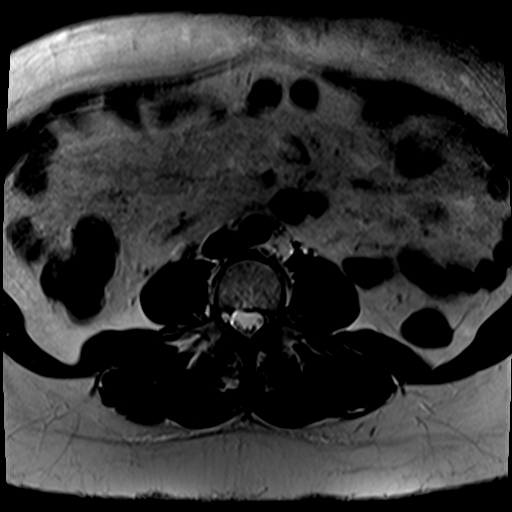

[Series 6: t2_tse axial fs · axial · 7.0mm · 0.55mm/px · z∈[-70,+148]mm · 3 of 25 slices shown]
[im 1/25]
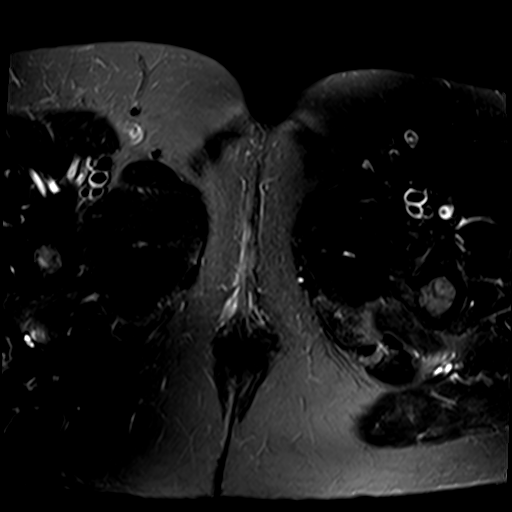
[im 13/25]
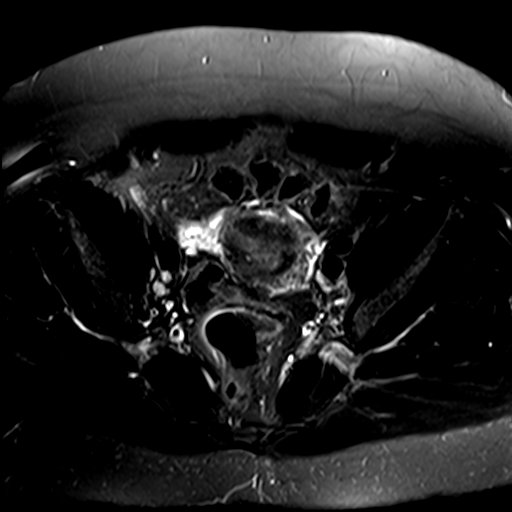
[im 25/25]
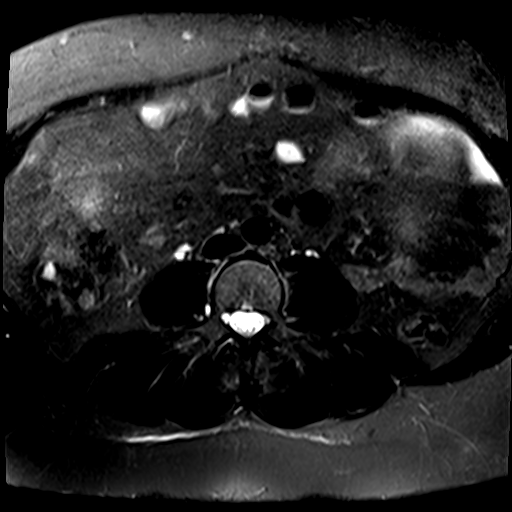

[Series 7: axial spgr no · axial · 7.0mm · 1.09mm/px · z∈[-61,+140]mm · 3 of 25 slices shown]
[im 1/25]
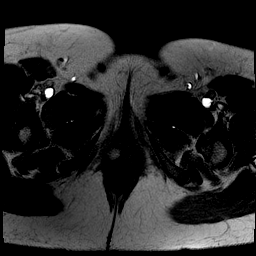
[im 13/25]
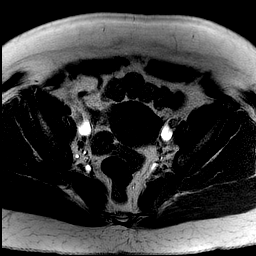
[im 25/25]
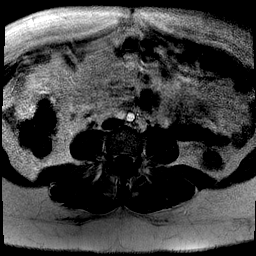

[Series 8: axial spgr fs-pre · axial · non-contrast · 7.0mm · 1.09mm/px · z∈[-61,+140]mm · 4 of 25 slices shown]
[im 1/25]
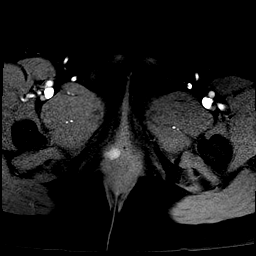
[im 9/25]
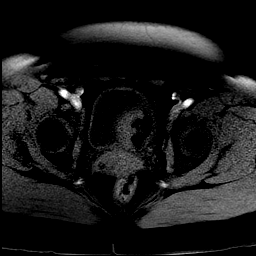
[im 17/25]
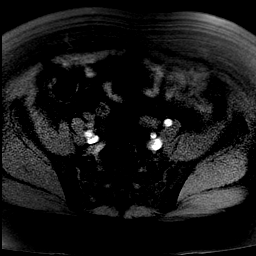
[im 25/25]
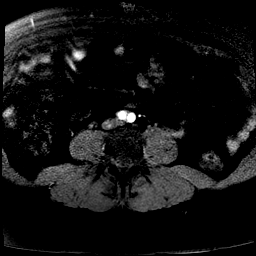

[Series 9: axial spgr fs-post · axial · 7.0mm · 1.09mm/px · z∈[-61,+140]mm · 4 of 25 slices shown (1 of 5)]
[im 1/25]
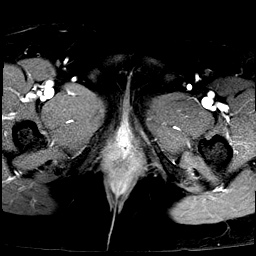
[im 9/25]
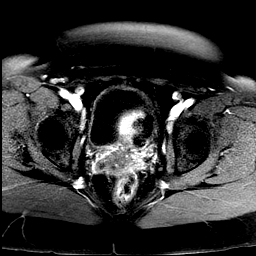
[im 17/25]
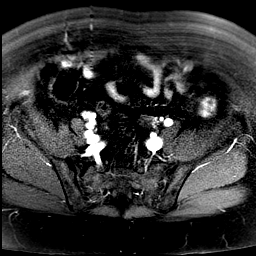
[im 25/25]
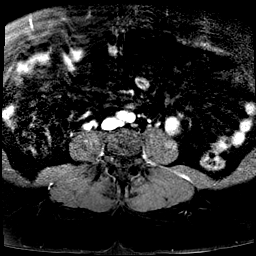

[Series 10: axial spgr fs-post · axial · 7.0mm · 1.09mm/px · z∈[-61,+140]mm · 4 of 25 slices shown (2 of 5)]
[im 1/25]
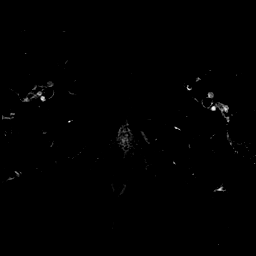
[im 9/25]
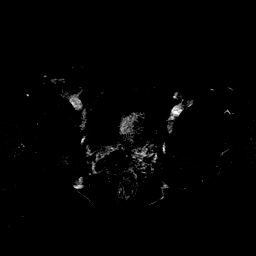
[im 17/25]
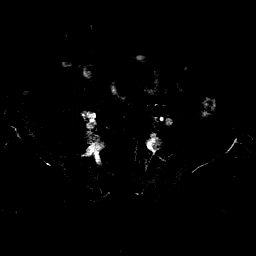
[im 25/25]
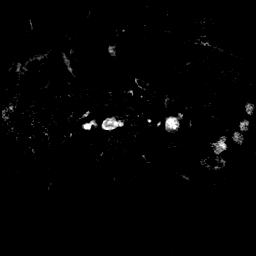

[Series 11: axial spgr fs-post · axial · 208.6mm · 1.09mm/px · 1 of 1 slices shown (3 of 5)]
[im 1/1]
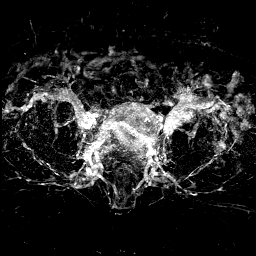

[Series 12: axial spgr fs-post · axial · 7.0mm · 1.09mm/px · z∈[-61,+140]mm · 4 of 25 slices shown (4 of 5)]
[im 1/25]
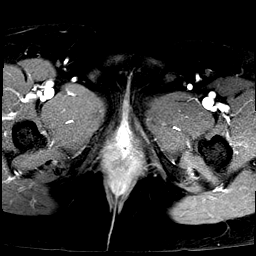
[im 9/25]
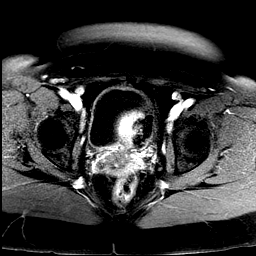
[im 17/25]
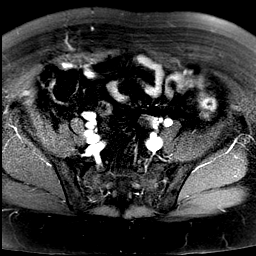
[im 25/25]
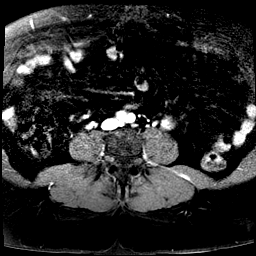

[Series 13: axial spgr fs-post · axial · 7.0mm · 1.09mm/px · z∈[-61,+73]mm · 3 of 25 slices shown (5 of 5)]
[im 1/25]
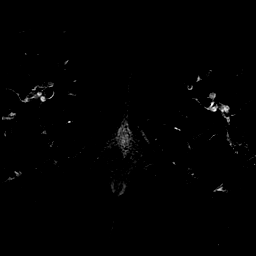
[im 9/25]
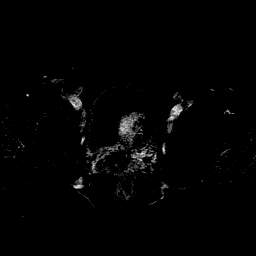
[im 17/25]
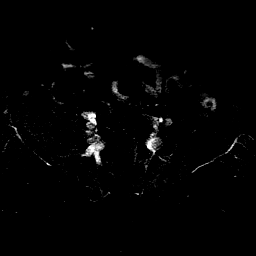

[37 of 48 positions shown; findings below may reference images not displayed]

FINDINGS: Lower Urinary Tract: Bladder is unremarkable in appearance. A
horseshoe shaped urethral diverticulum is noted which extends from
the 4-12 o'clock positions and measures approximately 2.8 x 0.9 cm.

Bowel: Visualized pelvic bowel loops are unremarkable. A small
paraumbilical hernia is seen containing only fat.

Vascular/Lymphatic: No pathologically enlarged lymph nodes or other
significant abnormality.

Reproductive:

-- Uterus: Measures 8.8 x 5.5 x 5.5 cm (volume = 140 cm^3). This
is significantly decreased in size compared to 8854 exam 1
calculated volume was 977 mL.

A single small intramural fibroid is seen in the anterior corpus
which currently measures 3.0 cm in maximum diameter compared to
cm on previous study. Previously seen smaller fibroids are no longer
visualized.

Thin endometrium and normal appearance of cervix noted. In addition,
there is a 1.6 cm Bartholin's gland cyst in the right lateral wall
of the lower vagina.

-- Intracavitary fibroids:  None.

-- Pedunculated fibroids: None.

-- Fibroid contrast enhancement: The solitary anterior fibroid
described above shows diffuse heterogeneous contrast enhancement.

-- Right ovary:  Appears normal.  No mass identified.

-- Left ovary:  Appears normal.  No mass identified.

Other: No abnormal free fluid. Several right renal cysts are
incidentally noted. No evidence of hydronephrosis.

Musculoskeletal:  Unremarkable.
IMPRESSION: Single 3 cm intramural fibroid in the anterior corpus, significantly
decreased in size from 10 cm on previous study. Overall uterine
volume is also significantly decreased by approximately 85% since
prior exam.

No intracavitary or pedunculated fibroids.

Normal appearance of both ovaries.  No adnexal mass identified.

Incidental findings include horseshoe shaped urethral diverticulum,
Bartholin's gland cyst, and paraumbilical ventral hernia.

## 2021-02-22 ENCOUNTER — Other Ambulatory Visit: Payer: Self-pay

## 2021-02-22 ENCOUNTER — Emergency Department (HOSPITAL_COMMUNITY)
Admission: EM | Admit: 2021-02-22 | Discharge: 2021-02-23 | Disposition: A | Discharge status: Home / Self Care | Payer: 59 | Attending: Emergency Medicine | Admitting: Emergency Medicine

## 2021-02-22 DIAGNOSIS — T50901A Poisoning by unspecified drugs, medicaments and biological substances, accidental (unintentional), initial encounter: Secondary | ICD-10-CM

## 2021-02-22 DIAGNOSIS — Y900 Blood alcohol level of less than 20 mg/100 ml: Secondary | ICD-10-CM | POA: Insufficient documentation

## 2021-02-22 DIAGNOSIS — T424X1A Poisoning by benzodiazepines, accidental (unintentional), initial encounter: Secondary | ICD-10-CM | POA: Insufficient documentation

## 2021-02-22 DIAGNOSIS — I10 Essential (primary) hypertension: Secondary | ICD-10-CM | POA: Diagnosis not present

## 2021-02-22 DIAGNOSIS — Z79899 Other long term (current) drug therapy: Secondary | ICD-10-CM | POA: Insufficient documentation

## 2021-02-22 LAB — COMPREHENSIVE METABOLIC PANEL
ALT: 17 U/L (ref 0–44)
AST: 20 U/L (ref 15–41)
Albumin: 4.1 g/dL (ref 3.5–5.0)
Alkaline Phosphatase: 66 U/L (ref 38–126)
Anion gap: 7 (ref 5–15)
BUN: 12 mg/dL (ref 6–20)
CO2: 30 mmol/L (ref 22–32)
Calcium: 9.8 mg/dL (ref 8.9–10.3)
Chloride: 102 mmol/L (ref 98–111)
Creatinine, Ser: 1.35 mg/dL — ABNORMAL HIGH (ref 0.44–1.00)
GFR, Estimated: 48 mL/min — ABNORMAL LOW (ref >60)
Glucose, Bld: 87 mg/dL (ref 70–99)
Potassium: 3.6 mmol/L (ref 3.5–5.1)
Sodium: 139 mmol/L (ref 135–145)
Total Bilirubin: 0.6 mg/dL (ref 0.3–1.2)
Total Protein: 7.7 g/dL (ref 6.5–8.1)

## 2021-02-22 LAB — RAPID URINE DRUG SCREEN, HOSP PERFORMED
Amphetamines: POSITIVE — AB
Barbiturates: NOT DETECTED
Benzodiazepines: POSITIVE — AB
Cocaine: NOT DETECTED
Opiates: NOT DETECTED
Tetrahydrocannabinol: POSITIVE — AB

## 2021-02-22 LAB — ACETAMINOPHEN LEVEL: Acetaminophen (Tylenol), Serum: <10 ug/mL — ABNORMAL LOW (ref 10–30)

## 2021-02-22 LAB — I-STAT BETA HCG BLOOD, ED (MC, WL, AP ONLY): I-stat hCG, quantitative: <5 m[IU]/mL (ref <5)

## 2021-02-22 LAB — CBC WITH DIFFERENTIAL/PLATELET
Abs Immature Granulocytes: 0 10*3/uL (ref 0.00–0.07)
Basophils Absolute: 0.1 10*3/uL (ref 0.0–0.1)
Basophils Relative: 1 %
Eosinophils Absolute: 0 10*3/uL (ref 0.0–0.5)
Eosinophils Relative: 1 %
HCT: 44.3 % (ref 36.0–46.0)
Hemoglobin: 14.5 g/dL (ref 12.0–15.0)
Immature Granulocytes: 0 %
Lymphocytes Relative: 53 %
Lymphs Abs: 3.7 10*3/uL (ref 0.7–4.0)
MCH: 28.9 pg (ref 26.0–34.0)
MCHC: 32.7 g/dL (ref 30.0–36.0)
MCV: 88.4 fL (ref 80.0–100.0)
Monocytes Absolute: 0.4 10*3/uL (ref 0.1–1.0)
Monocytes Relative: 5 %
Neutro Abs: 2.8 10*3/uL (ref 1.7–7.7)
Neutrophils Relative %: 40 %
Platelets: 390 10*3/uL (ref 150–400)
RBC: 5.01 MIL/uL (ref 3.87–5.11)
RDW: 13.8 % (ref 11.5–15.5)
WBC: 7 10*3/uL (ref 4.0–10.5)
nRBC: 0 % (ref 0.0–0.2)

## 2021-02-22 LAB — ETHANOL: Alcohol, Ethyl (B): <10 mg/dL (ref <10)

## 2021-02-22 LAB — SALICYLATE LEVEL: Salicylate Lvl: <7 mg/dL — ABNORMAL LOW (ref 7.0–30.0)

## 2021-02-22 NOTE — ED Triage Notes (Addendum)
Pt states she took 6 xanax and 1 trazadone. Pt she was not trying to kill herself but she was feeling very depressed and wanted to go to sleep. Pt states she is dealing with a lot of stress in her life right now. Pt denies SI/HI.

## 2021-02-22 NOTE — ED Provider Notes (Signed)
Emergency Medicine Provider Triage Evaluation Note  Annette Diaz , a 50 y.o. female  was evaluated in triage.  Pt complains of overdose. States she took six 1 mg tablets of xanax and one trazodone at 12 pm as well because she just wanted to go to sleep. States she feels tired  Review of Systems  Positive: Overdose, si Negative: fever  Physical Exam  BP 110/84 (BP Location: Right Arm)   Pulse 85   Temp 98.7 F (37.1 C) (Oral)   Resp 14   Ht 5' 2.5" (1.588 m)   Wt 88.9 kg   SpO2 98%   BMI 35.28 kg/m  Gen:   Awake, no distress   Resp:  Normal effort  MSK:   Moves extremities without difficulty  Other:  Awake, alert, answering questions appropriately  Medical Decision Making  Medically screening exam initiated at 8:17 PM.  Appropriate orders placed.  Annette Diaz was informed that the remainder of the evaluation will be completed by another provider, this initial triage assessment does not replace that evaluation, and the importance of remaining in the ED until their evaluation is complete.     Annette Isaacson S, PA-C [id number] [id number]    Annette Stall P, DO [id number]

## 2021-02-23 ENCOUNTER — Other Ambulatory Visit: Payer: Self-pay

## 2021-02-23 ENCOUNTER — Ambulatory Visit (HOSPITAL_COMMUNITY)
Admission: EM | Admit: 2021-02-23 | Discharge: 2021-02-24 | Disposition: A | Discharge status: Home / Self Care | Payer: 59

## 2021-02-23 DIAGNOSIS — F411 Generalized anxiety disorder: Secondary | ICD-10-CM | POA: Diagnosis not present

## 2021-02-23 DIAGNOSIS — Z046 Encounter for general psychiatric examination, requested by authority: Secondary | ICD-10-CM

## 2021-02-23 NOTE — ED Notes (Signed)
Safety contract signed by pt, EDP Charlann Lange, PA-C), scanned to pt file & placed in med rec drawer

## 2021-02-23 NOTE — Discharge Instructions (Signed)
Please return to the ED with any severe depression, thought of wanting to harm yourself, or if we can assist you in any other way.

## 2021-02-23 NOTE — ED Provider Notes (Signed)
[facility] EMERGENCY DEPARTMENT Provider Note   CSN: [id number] Arrival date & time: 02/22/21  1920     History Chief Complaint  Patient presents with   Drug Overdose    Annette Diaz is a 50 y.o. female.  Patient to ED voluntarily on the advice of her counselor after taking 6 Xanax (1 mg), and one trazodone at around noon to help her sleep. She adamantly denies SI. She states her counselor wanted her to be medically evaluated. No history of suicide attempt on chart review. No physical complaints.   The history is provided by the patient. No language interpreter was used.  Drug Overdose      Past Medical History:  Diagnosis Date   Depression    GERD (gastroesophageal reflux disease)    Hypertension     Patient Active Problem List   Diagnosis Date Noted   S/P myomectomy 07/14/2015    Past Surgical History:  Procedure Laterality Date   BREAST SURGERY Bilateral 2018   reduction   IR RADIOLOGIST EVAL & MGMT  11/19/2018   IUD REMOVAL N/A 07/14/2015   Procedure: INTRAUTERINE DEVICE (IUD) REMOVAL;  Surgeon: Servando Salina, MD;  Location: [facility];  Service: Gynecology;  Laterality: N/A;   MYOMECTOMY N/A 07/14/2015   Procedure: Exploratory Laparotomy MYOMECTOMY;  Surgeon: Servando Salina, MD;  Location: [facility];  Service: Gynecology;  Laterality: N/A;   NO PAST SURGERIES     UMBILICAL HERNIA REPAIR N/A 04/23/2019   Procedure: LAPAROSCOPIC UMBILICAL HERNIA REPAIR  WITH MESH;  Surgeon: Ralene Ok, MD;  Location: [facility];  Service: General;  Laterality: N/A;     OB History   No obstetric history on file.     No family history on file.  Social History   Tobacco Use   Smoking status: Never  Substance Use Topics   Alcohol use: No   Drug use: No    Home Medications Prior to Admission medications   Medication Sig Start Date End Date Taking? Authorizing Provider  ALPRAZolam Duanne Moron) 1 MG tablet Take 1 mg by mouth at bedtime as needed for  anxiety or sleep.    [provider]  Ascorbic Acid (VITAMIN C PO) Take 1 tablet by mouth once a week.    [provider]  ASHWAGANDHA PO Take 1 capsule by mouth See admin instructions. Take 1 capsule by mouth 5 days per week    [provider]  Biotin 5 MG CAPS Take 5 mg by mouth daily.    [provider]  Cranberry-Vitamin C (AZO CRANBERRY URINARY TRACT PO) Take 2 tablets by mouth daily.    [provider]  desvenlafaxine (PRISTIQ) 100 MG 24 hr tablet Take 100 mg by mouth daily.    [provider]  Digestive Enzymes (PAPAYA AND ENZYMES PO) Take 4 tablets by mouth daily as needed (feeling full).    [provider]  ELDERBERRY PO Take 1 capsule by mouth once a week.    [provider]  losartan-hydrochlorothiazide (HYZAAR) 100-12.5 MG tablet Take 1 tablet by mouth daily.    [provider]  omeprazole (PRILOSEC) 40 MG capsule Take 40 mg by mouth daily.    [provider]  VITAMIN D PO Take 1 capsule by mouth once a week.    [provider]    Allergies    Patient has no known allergies.  Review of Systems   Review of Systems  Constitutional:  Negative for chills and fever.  HENT: Negative.  Respiratory: Negative.    Cardiovascular: Negative.   Gastrointestinal: Negative.   Musculoskeletal: Negative.   Skin: Negative.   Neurological: Negative.   Psychiatric/Behavioral:  Positive for sleep disturbance. Negative for suicidal ideas.    Physical Exam Updated Vital Signs BP 110/84 (BP Location: Right Arm)   Pulse 85   Temp 98.7 F (37.1 C) (Oral)   Resp 14   Ht 5' 2.5" (1.588 m)   Wt 88.9 kg   SpO2 98%   BMI 35.28 kg/m   Physical Exam Vitals and nursing note reviewed.  Constitutional:      General: She is not in acute distress.    Appearance: She is well-developed. She is not ill-appearing.  Pulmonary:     Effort: Pulmonary effort is normal.  Musculoskeletal:        General:  Normal range of motion.     Cervical back: Normal range of motion.  Skin:    General: Skin is warm and dry.  Neurological:     Mental Status: She is alert and oriented to person, place, and time.  Psychiatric:        Attention and Perception: She does not perceive auditory or visual hallucinations.        Mood and Affect: Mood normal.        Speech: Speech normal.        Behavior: Behavior is cooperative.        Thought Content: Thought content does not include homicidal or suicidal ideation.        Cognition and Memory: Cognition normal.    ED Results / Procedures / Treatments   Labs (all labs ordered are listed, but only abnormal results are displayed) Labs Reviewed  COMPREHENSIVE METABOLIC PANEL - Abnormal; Notable for the following components:      Result Value   Creatinine, Ser 1.35 (*)    GFR, Estimated 48 (*)    All other components within normal limits  SALICYLATE LEVEL - Abnormal; Notable for the following components:   Salicylate Lvl [id number] (*)    All other components within normal limits  ACETAMINOPHEN LEVEL - Abnormal; Notable for the following components:   Acetaminophen (Tylenol), Serum <10 (*)    All other components within normal limits  RAPID URINE DRUG SCREEN, HOSP PERFORMED - Abnormal; Notable for the following components:   Benzodiazepines POSITIVE (*)    Amphetamines POSITIVE (*)    Tetrahydrocannabinol POSITIVE (*)    All other components within normal limits  RESP PANEL BY RT-PCR (FLU A&B, COVID) ARPGX2  CBC WITH DIFFERENTIAL/PLATELET  ETHANOL  I-STAT BETA HCG BLOOD, ED ([facility], [facility], [facility] ONLY)    EKG None  Radiology No results found.  Procedures Procedures   Medications Ordered in ED Medications - No data to display  ED Course  I have reviewed the triage vital signs and the nursing notes.  Pertinent labs & imaging results that were available during my care of the patient were reviewed by me and considered in my medical decision making (see chart  for details).    MDM Rules/Calculators/A&P                           Patient to ED as detailed in the HPI after taking 6 mg Xanax and a trazodone to facilitate sleep. She is adamant that she is not wanting to intentionally harm or kill herself.   The patient is requesting to be discharged prior to TTS evaluation. She is considered  medically stable after >4 hours observation in the ED.   She is consistent in her denial of SI. She denies having weapons in the house. She states she is of [place] belief and does not believe in suicide. She has strong support resources in the community. She is willing to sign a Theatre manager.   I do not feel she is a threat to herself and is safe in discharge. She is felt reliable for outpatient follow up as planned with her counselor.    Final Clinical Impression(s) / ED Diagnoses Final diagnoses:  None   Accidental overdose  Rx / DC Orders ED Discharge Orders     None        Charlann Lange, PA-C 02/23/21 0517    Merryl Hacker, MD 02/23/21 [phone]

## 2021-02-24 DIAGNOSIS — Z046 Encounter for general psychiatric examination, requested by authority: Secondary | ICD-10-CM

## 2021-02-24 DIAGNOSIS — F411 Generalized anxiety disorder: Secondary | ICD-10-CM | POA: Diagnosis not present
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
## 2021-02-24 NOTE — Discharge Instructions (Signed)
  Discharge recommendations:  Patient is to take medications as prescribed. Please see information for follow-up appointment with psychiatry and therapy. Please follow up with your primary care provider for all medical related needs.   Therapy: We recommend that patient participate in individual therapy to address mental health concerns.  Medications: The patient is to contact a medical professional and/or outpatient provider to address any new side effects that develop. Patient should update outpatient providers of any new medications and/or medication changes.    Safety:  The patient should abstain from use of illicit substances/drugs and abuse of any medications. If symptoms worsen or do not continue to improve or if the patient becomes actively suicidal or homicidal then it is recommended that the patient return to the closest hospital emergency department, the [facility], or call 911 for further evaluation and treatment. National Suicide Prevention Lifeline 1-800-SUICIDE or (719) 474-2282.  About 988 988 offers 24/7 access to trained crisis counselors who can help people experiencing mental health-related distress. People can call or text 988 or chat 988lifeline.org for themselves or if they are worried about a loved one who may need crisis support.

## 2021-02-24 NOTE — ED Notes (Signed)
IVC RESCINDED BY NP JASON BERRY.  PT CLEARED FOR DISCHARGE HOME. VERBALIZED UNDERSTANDING OF DC INSTRUCTIONS. [employer] DISPATCH [place].  PT WAITING IN LOBBY.

## 2021-02-24 NOTE — ED Provider Notes (Signed)
Behavioral [facility] Urgent Care Medical Screening Exam  Patient Name: Annette Diaz MRN: [id number] Date of Evaluation: 02/24/21 Chief Complaint:   Diagnosis:  Final diagnoses:  GAD (generalized anxiety disorder)  Involuntary commitment    History of Present illness: Annette Diaz is a 50 year old patient who was involuntarily brought to the [facility]) via the [employer] under IVC paperwork that was completed by her younger daughter. The IVC paperwork states: "Respondent does not have any known diagnoses. Respondent's daughter advises that seh is prescribed Xanax, along with other depression and anxiety medication. On yesterday, respondent sent out a mass text to family members stating that she no longer wants to be here, and for them to not respond to this message. Respondent was found unresponsive of a suicide attempt by overdose, but admitted to lying to hospital staff about this attempt in order to be released. Daughter advises that respondent stated again that she no longer wants to live since she was released from hospital."   Pt states, " I missed my medication for 2 days. Yesterday I intentionally took 6 Xanax to help me go to sleep; I talked to my counselor afterwards and she advised me to go to the hospital, which I did." Pt shares that she was in a "bad place" yesterday and that, prior to taking the medication, she sent out a text message to family members indicating that she was tired. She denies that she threatened to harm herself. Pt states she has no intention of killing herself and that, at times, she is tired and would like to rest/get away from things for a while so she can rest; she's had the thought that she wouldn't care if she didn't wake up. Pt's daughters and God-daughter were concerned with pt's actions yesterday and her younger daughter IVCed her at the advice of her older daughter. Patient reports that her oldest daughter recently came to her house after  they have not spoken in 3 months. She reports this as a stressor as they do not have the best relationship. She states that her oldest daughter and her daughter's boyfriend lives in one of her rental properties and pay a modest amount of rent. She states that he daughter recently inherited money from the death of her paternal grandmother. She states that her daughter and her boyfriend have been spending money traveling etc but have not offered to pay any additional rent. Patient reports that she will occasionally take 4 mg of xanax to help her sleep. She states that she has xanax left over from her last prescription 6 months ago. PDMP indicates that the patient's last prescription for xanax was approximately 6 months ago. She reports that she met with her counselor today and has an appointment with her psychiatrist on Friday.   Reviewed TTS assessment and validated with patient. On evaluation patient is alert and oriented x 4, pleasant, and cooperative. Speech is clear and coherent. Mood is depressed and affect is congruent with mood. Thought process is coherent and thought content is logical. Denies auditory and visual hallucinations. No indication that patient is responding to internal stimuli. No evidence of delusional thought content. Adamantly denies suicidal ideations. Denies homicidal ideations. Denies substance abuse.    ED provider note 02/23/2021: Patient to ED as detailed in the HPI after taking 6 mg Xanax and a trazodone to facilitate sleep. She is adamant that she is not wanting to intentionally harm or kill herself. The patient is requesting to be discharged prior to TTS  evaluation. She is considered medically stable after >4 hours observation in the ED. She is consistent in her denial of SI. She denies having weapons in the house. She states she is of [place] belief and does not believe in suicide. She has strong support resources in the community. She is willing to sign a Theatre manager. I do  not feel she is a threat to herself and is safe in discharge. She is felt reliable for outpatient follow up as planned with her counselor.   Psychiatric Specialty Exam  Presentation  General Appearance:Appropriate for Environment; Neat  Eye Contact:Good  Speech:Clear and Coherent; Normal Rate  Speech Volume:Normal  Handedness:No data recorded  Mood and Affect  Mood:Anxious  Affect:Congruent   Thought Process  Thought Processes:Coherent; Goal Directed; Linear  Descriptions of Associations:Intact  Orientation:Full (Time, Place and Person)  Thought Content:WDL    Hallucinations:None  Ideas of Reference:None  Suicidal Thoughts:No  Homicidal Thoughts:No   Sensorium  Memory:Immediate Good; Recent Good; Remote Good  Judgment:Fair  Insight:Good   Executive Functions  Concentration:Good  Attention Span:Good  [place]  Language:Good   Psychomotor Activity  Psychomotor Activity:Normal   Assets  Assets:Communication Skills; Desire for Improvement; Financial Resources/Insurance; Housing; Physical Health; Social Support; Resilience; Transportation   Sleep  Sleep:Fair  Number of hours:  No data recorded  No data recorded  Physical Exam: Physical Exam Vitals and nursing note reviewed.  Constitutional:      General: She is not in acute distress.    Appearance: She is well-developed. She is not ill-appearing, toxic-appearing or diaphoretic.  HENT:     Head: Normocephalic and atraumatic.     Right Ear: External ear normal.     Left Ear: External ear normal.  Eyes:     Conjunctiva/sclera: Conjunctivae normal.     Pupils: Pupils are equal, round, and reactive to light.  Cardiovascular:     Rate and Rhythm: Normal rate.  Pulmonary:     Effort: Pulmonary effort is normal. No respiratory distress.  Musculoskeletal:        General: Normal range of motion.     Cervical back: Normal range of motion.  Skin:    General: Skin  is warm and dry.  Neurological:     Mental Status: She is alert and oriented to person, place, and time.  Psychiatric:        Mood and Affect: Mood is anxious.        Speech: Speech normal.        Behavior: Behavior is cooperative.        Thought Content: Thought content is not paranoid or delusional. Thought content does not include homicidal or suicidal ideation.   Review of Systems  Constitutional:  Negative for chills, diaphoresis, fever, malaise/fatigue and weight loss.  HENT:  Negative for congestion.   Respiratory:  Negative for cough and shortness of breath.   Cardiovascular:  Negative for chest pain and palpitations.  Gastrointestinal:  Negative for diarrhea, nausea and vomiting.  Neurological:  Negative for dizziness and seizures.  Psychiatric/Behavioral:  Positive for depression and suicidal ideas. Negative for hallucinations, memory loss and substance abuse. The patient is nervous/anxious and has insomnia.   All other systems reviewed and are negative.  Blood pressure 130/86, pulse 89, temperature 98 F (36.7 C), temperature source Oral, resp. rate 16, SpO2 95 %. There is no height or weight on file to calculate BMI.  Musculoskeletal: Strength & Muscle Tone: within normal limits Gait & Station: normal Patient  leans: N/A  Demographic Factors:  Divorced or widowed  Loss Factors: Financial problems/change in socioeconomic status. Job change that pays less. 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   Historical Factors: NA  Risk Reduction Factors:   Sense of responsibility to family, Religious beliefs about death, Employed, Living with another person, especially a relative, Positive social support, and Positive therapeutic relationship  Continued Clinical Symptoms:  Anxiety and depression  Cognitive Features That Contribute To Risk:  None    Suicide Risk:  Mild:  Suicidal ideation of limited frequency, intensity, duration, and  specificity.  There are no identifiable plans, no associated intent, mild dysphoria and related symptoms, good self-control (both objective and subjective assessment), few other risk factors, and identifiable protective factors, including available and accessible social support.   [facility] MSE Discharge Disposition for Follow up and Recommendations: Based on my evaluation the patient does not appear to have an emergency medical condition and can be discharged with resources and follow up care in outpatient services for Medication Management and Individual Therapy Patient encouraged to take pristiq on a consistent basis as prescribed by her psychiatrist.   Base on my evaluation, the evaluation of TTS, and chart review the patient is not an imminent risk to self or others. IVC rescinded.   Follow up with counselor and Dr. Toy Care as scheduled.     Rozetta Nunnery, NP 02/24/2021, 12:51 AM

## 2022-06-08 ENCOUNTER — Other Ambulatory Visit: Payer: Self-pay | Admitting: Family Medicine

## 2022-06-08 DIAGNOSIS — E785 Hyperlipidemia, unspecified: Secondary | ICD-10-CM

## 2022-07-13 ENCOUNTER — Other Ambulatory Visit: Payer: Self-pay

## 2023-06-29 ENCOUNTER — Emergency Department (HOSPITAL_COMMUNITY)
Admission: EM | Admit: 2023-06-29 | Discharge: 2023-06-29 | Disposition: A | Discharge status: Home / Self Care | Payer: Commercial Managed Care - PPO | Attending: Emergency Medicine | Admitting: Emergency Medicine

## 2023-06-29 ENCOUNTER — Emergency Department (HOSPITAL_COMMUNITY): Payer: Commercial Managed Care - PPO

## 2023-06-29 ENCOUNTER — Encounter (HOSPITAL_COMMUNITY): Payer: Self-pay | Admitting: Emergency Medicine

## 2023-06-29 DIAGNOSIS — R0981 Nasal congestion: Secondary | ICD-10-CM | POA: Insufficient documentation

## 2023-06-29 DIAGNOSIS — R5383 Other fatigue: Secondary | ICD-10-CM | POA: Insufficient documentation

## 2023-06-29 DIAGNOSIS — R000 Tachycardia, unspecified: Secondary | ICD-10-CM | POA: Insufficient documentation

## 2023-06-29 DIAGNOSIS — Z79899 Other long term (current) drug therapy: Secondary | ICD-10-CM | POA: Diagnosis not present

## 2023-06-29 DIAGNOSIS — Z20822 Contact with and (suspected) exposure to covid-19: Secondary | ICD-10-CM | POA: Diagnosis not present

## 2023-06-29 DIAGNOSIS — R053 Chronic cough: Secondary | ICD-10-CM | POA: Diagnosis present

## 2023-06-29 DIAGNOSIS — I10 Essential (primary) hypertension: Secondary | ICD-10-CM | POA: Insufficient documentation

## 2023-06-29 DIAGNOSIS — R300 Dysuria: Secondary | ICD-10-CM | POA: Insufficient documentation

## 2023-06-29 LAB — CBC WITH DIFFERENTIAL/PLATELET
Abs Immature Granulocytes: 0.02 10*3/uL (ref 0.00–0.07)
Basophils Absolute: 0 10*3/uL (ref 0.0–0.1)
Basophils Relative: 0 %
Eosinophils Absolute: 0 10*3/uL (ref 0.0–0.5)
Eosinophils Relative: 0 %
HCT: 41.7 % (ref 36.0–46.0)
Hemoglobin: 13 g/dL (ref 12.0–15.0)
Immature Granulocytes: 0 %
Lymphocytes Relative: 11 %
Lymphs Abs: 1 10*3/uL (ref 0.7–4.0)
MCH: 28 pg (ref 26.0–34.0)
MCHC: 31.2 g/dL (ref 30.0–36.0)
MCV: 89.7 fL (ref 80.0–100.0)
Monocytes Absolute: 0.4 10*3/uL (ref 0.1–1.0)
Monocytes Relative: 4 %
Neutro Abs: 8.1 10*3/uL — ABNORMAL HIGH (ref 1.7–7.7)
Neutrophils Relative %: 85 %
Platelets: 347 10*3/uL (ref 150–400)
RBC: 4.65 MIL/uL (ref 3.87–5.11)
RDW: 14.7 % (ref 11.5–15.5)
WBC: 9.5 10*3/uL (ref 4.0–10.5)
nRBC: 0 % (ref 0.0–0.2)

## 2023-06-29 LAB — URINALYSIS, ROUTINE W REFLEX MICROSCOPIC
Bilirubin Urine: NEGATIVE
Glucose, UA: NEGATIVE mg/dL
Hgb urine dipstick: NEGATIVE
Ketones, ur: NEGATIVE mg/dL
Leukocytes,Ua: NEGATIVE
Nitrite: NEGATIVE
Protein, ur: NEGATIVE mg/dL
Specific Gravity, Urine: 1.019 (ref 1.005–1.030)
pH: 7 (ref 5.0–8.0)

## 2023-06-29 LAB — BASIC METABOLIC PANEL
Anion gap: 9 (ref 5–15)
BUN: 12 mg/dL (ref 6–20)
CO2: 25 mmol/L (ref 22–32)
Calcium: 8.9 mg/dL (ref 8.9–10.3)
Chloride: 105 mmol/L (ref 98–111)
Creatinine, Ser: 1.06 mg/dL — ABNORMAL HIGH (ref 0.44–1.00)
GFR, Estimated: >60 mL/min (ref >60)
Glucose, Bld: 120 mg/dL — ABNORMAL HIGH (ref 70–99)
Potassium: 4 mmol/L (ref 3.5–5.1)
Sodium: 139 mmol/L (ref 135–145)

## 2023-06-29 LAB — RESP PANEL BY RT-PCR (RSV, FLU A&B, COVID)  RVPGX2
Influenza A by PCR: NEGATIVE
Influenza B by PCR: NEGATIVE
Resp Syncytial Virus by PCR: NEGATIVE
SARS Coronavirus 2 by RT PCR: NEGATIVE

## 2023-06-29 LAB — TROPONIN I (HIGH SENSITIVITY): Troponin I (High Sensitivity): <2 ng/L (ref <18)

## 2023-06-29 LAB — GROUP A STREP BY PCR: Group A Strep by PCR: NOT DETECTED

## 2023-06-29 MED ORDER — BENZONATATE 100 MG PO CAPS
100.0000 mg | ORAL_CAPSULE | Freq: Once | ORAL | Status: AC
  Administered 2023-06-29: 100 mg via ORAL
  Filled 2023-06-29: qty 1

## 2023-06-29 MED ORDER — DM-GUAIFENESIN ER 30-600 MG PO TB12: 1.0000 | ORAL_TABLET | Freq: Two times a day (BID) | ORAL | 0 refills | Status: AC

## 2023-06-29 NOTE — ED Triage Notes (Signed)
Pt here from home with multiple complaints including burning upon urination and cough and congestion and would like her labs checked

## 2023-06-29 NOTE — ED Provider Notes (Signed)
[place] EMERGENCY DEPARTMENT AT [facility] Provider Note   CSN: [id number] Arrival date & time: 06/29/23  1048     History  Chief Complaint  Patient presents with   Cough    Annette Diaz is a 53 y.o. female with PMHx of HTN and depression presents to ED for audible concerns.  She is of productive cough, decreased energy for past three months. Niece diagnosed with walking PNA. Granddaughter diagnosed with strep and is worried about being exposed.   She also complains of burning with urination.  She denies hematuria, frequency, decreased urine output, no fevers  She also wishes to have her lab work checked.   Cough Associated symptoms: no chest pain, no chills, no fever, no headaches, no shortness of breath and no wheezing        Home Medications Prior to Admission medications   Medication Sig Start Date End Date Taking? Authorizing Provider  dextromethorphan-guaiFENesin (MUCINEX DM) 30-600 MG 12hr tablet Take 1 tablet by mouth 2 (two) times daily for 10 days. 06/29/23 07/09/23 Yes Judithann Sheen, PA  ALPRAZolam Prudy Feeler) 1 MG tablet Take 1 mg by mouth at bedtime as needed for anxiety or sleep.    [provider]  Ascorbic Acid (VITAMIN C PO) Take 1 tablet by mouth once a week.    [provider]  ASHWAGANDHA PO Take 1 capsule by mouth See admin instructions. Take 1 capsule by mouth 5 days per week    [provider]  Biotin 5 MG CAPS Take 5 mg by mouth daily.    [provider]  Cranberry-Vitamin C (AZO CRANBERRY URINARY TRACT PO) Take 2 tablets by mouth daily.    [provider]  desvenlafaxine (PRISTIQ) 100 MG 24 hr tablet Take 100 mg by mouth daily.    [provider]  Digestive Enzymes (PAPAYA AND ENZYMES PO) Take 4 tablets by mouth daily as needed (feeling full).    [provider]  ELDERBERRY PO Take 1 capsule by mouth once a week.    [provider]  losartan-hydrochlorothiazide  (HYZAAR) 100-12.5 MG tablet Take 1 tablet by mouth daily.    [provider]  omeprazole (PRILOSEC) 40 MG capsule Take 40 mg by mouth daily.    [provider]  VITAMIN D PO Take 1 capsule by mouth once a week.    [provider]      Allergies    Patient has no known allergies.    Review of Systems   Review of Systems  Constitutional:  Positive for fatigue. Negative for chills and fever.  Respiratory:  Positive for cough. Negative for chest tightness, shortness of breath and wheezing.   Cardiovascular:  Negative for chest pain and palpitations.  Gastrointestinal:  Negative for abdominal pain, constipation, diarrhea, nausea and vomiting.  Genitourinary:  Positive for dysuria.  Neurological:  Negative for dizziness, seizures, weakness, light-headedness, numbness and headaches.    Physical Exam Updated Vital Signs BP (!) 136/100 (BP Location: Left Arm)   Pulse (!) 103   Temp 98.1 F (36.7 C) (Oral)   Resp 17   SpO2 98%  Physical Exam Vitals and nursing note reviewed.  Constitutional:      General: She is not in acute distress.    Appearance: Normal appearance. She is not ill-appearing or toxic-appearing.  HENT:     Head: Normocephalic and atraumatic.     Jaw: No trismus, tenderness, swelling or pain on movement.     Right Ear: Hearing,  tympanic membrane, ear canal and external ear normal.     Left Ear: Hearing, tympanic membrane, ear canal and external ear normal.     Nose: Nose normal.     Right Nostril: No foreign body, epistaxis or septal hematoma.     Left Nostril: No foreign body, epistaxis or septal hematoma.     Right Sinus: No maxillary sinus tenderness or frontal sinus tenderness.     Left Sinus: No maxillary sinus tenderness or frontal sinus tenderness.     Mouth/Throat:     Mouth: Mucous membranes are moist.     Pharynx: No oropharyngeal exudate or posterior oropharyngeal erythema.  Eyes:     General: Vision grossly intact. No scleral  icterus.       Right eye: No discharge.        Left eye: No discharge.     Extraocular Movements: Extraocular movements intact.     Right eye: Normal extraocular motion and no nystagmus.     Left eye: Normal extraocular motion and no nystagmus.     Conjunctiva/sclera: Conjunctivae normal.     Right eye: Right conjunctiva is not injected. No chemosis, exudate or hemorrhage.    Left eye: Left conjunctiva is not injected. No chemosis, exudate or hemorrhage.    Pupils: Pupils are equal, round, and reactive to light.  Neck:     Comments: No meningeal signs Cardiovascular:     Rate and Rhythm: Normal rate.  Pulmonary:     Effort: Pulmonary effort is normal. No respiratory distress.  Abdominal:     General: Bowel sounds are normal. There is no distension.     Palpations: Abdomen is soft.     Tenderness: There is no abdominal tenderness. There is no right CVA tenderness, left CVA tenderness, guarding or rebound.  Musculoskeletal:     Cervical back: Normal range of motion and neck supple. No rigidity or tenderness.     Right lower leg: No edema.     Left lower leg: No edema.  Lymphadenopathy:     Cervical: No cervical adenopathy.  Skin:    General: Skin is warm.     Capillary Refill: Capillary refill takes less than 2 seconds.     Coloration: Skin is not jaundiced or pale.  Neurological:     Mental Status: She is alert and oriented to person, place, and time. Mental status is at baseline.     Cranial Nerves: No cranial nerve deficit.     Sensory: No sensory deficit.     Motor: No weakness.     Coordination: Coordination normal.     Gait: Gait normal.     Deep Tendon Reflexes: Reflexes normal.     ED Results / Procedures / Treatments   Labs (all labs ordered are listed, but only abnormal results are displayed) Labs Reviewed  CBC WITH DIFFERENTIAL/PLATELET - Abnormal; Notable for the following components:      Result Value   Neutro Abs 8.1 (*)    All other components within  normal limits  BASIC METABOLIC PANEL - Abnormal; Notable for the following components:   Glucose, Bld 120 (*)    Creatinine, Ser 1.06 (*)    All other components within normal limits  RESP PANEL BY RT-PCR (RSV, FLU A&B, COVID)  [id number]  GROUP A STREP BY PCR  URINALYSIS, ROUTINE W REFLEX MICROSCOPIC  TROPONIN I (HIGH SENSITIVITY)  TROPONIN I (HIGH SENSITIVITY)    EKG EKG Interpretation Date/Time:  Friday June 29 2023 11:49:08 EST Ventricular Rate:  104 PR Interval:  140 QRS Duration:  79 QT Interval:  349 QTC Calculation: 459 R Axis:   27  Text Interpretation: Sinus tachycardia rate is faster, otherwise ECG is similar to Sept 2022 Confirmed by Pricilla Loveless [phone]) on 06/29/2023 11:58:58 AM  Radiology DG Chest Port 1 View Result Date: 06/29/2023 CLINICAL DATA:  Congestion. EXAM: PORTABLE CHEST 1 VIEW COMPARISON:  01/08/2013. FINDINGS: Bilateral lung fields are clear. Bilateral costophrenic angles are clear. Elevated left hemidiaphragm noted. Normal cardio-mediastinal silhouette. No acute osseous abnormalities. The soft tissues are within normal limits. IMPRESSION: No active disease. Electronically Signed   By: Jules Schick M.D.   On: 06/29/2023 13:48    Procedures Procedures    Medications Ordered in ED Medications  benzonatate (TESSALON) capsule 100 mg (100 mg Oral Given 06/29/23 1507)    ED Course/ Medical Decision Making/ A&P                                 Medical Decision Making Risk OTC drugs.   Patient presents to the ED for concern of cough, nasal congestion, fatigue, this involves an extensive number of treatment options, and is a complaint that carries with it a high risk of complications and morbidity.  The differential diagnosis includes infection, pneumonia, lecture abnormality, anemia, strep, AC S, UTI I.  Not an exhaustive list   Co morbidities that complicate the patient evaluation  HPI   Additional history obtained:  Additional history  obtained from Nursing and Outside Medical Records   External records from outside source obtained and reviewed including  Triage RN note Recent medical evaluations and medication list   Lab Tests:  I Ordered, and personally interpreted labs.  The pertinent results include:   Negative respiratory panel Troponin negative x 1 No gross abnormality requiring ED intervention UA negative for infection   Imaging Studies ordered:  I ordered imaging studies including CXR  I independently visualized and interpreted imaging which showed cute cardiopulmonary pathology I agree with the radiologist interpretation   Cardiac Monitoring:  The patient was maintained on a cardiac monitor.  I personally viewed and interpreted the cardiac monitored which showed an underlying rhythm of: NSR with no STE nor ischemia   Medicines ordered and prescription drug management:  I ordered medication including Mucinex for cough Reevaluation of the patient after these medicines showed that the patient stayed the same I have reviewed the patients home medicines and have made adjustments as needed    Problem List / ED Course:  Dysuria UA negative for infection Nasal congestion, cough X-ray negative for pneumonia, effusion, obstruction in esophagus Respiratory panel negative.  Strep negative Provide Mucinex DM for cough and symptoms. Mild tachycardia of 103 No complaints of chest pain but mildly tachycardic at 103 EKG significant for sinus tach with no STE nor ischemia , Tachycardia may be due to viral infection Fatigue Patient is to follow-up with PCP reference that I provided her.  ED workup is grossly unremarkable to explain symptoms.  She would benefit from patient establishment and outpatient lab work to be monitored.   Do not see any emergent cause for ED intervention or admission at this time Discussed ED workup, return to emergency department cautions with patient expresses understand agrees  with plan.  All questions answered to her satisfaction.  She agrees is agreeable to discharge.   Reevaluation:  After the interventions noted above, I reevaluated the patient and found that  they have :improved   Social Determinants of Health:  PCP follow-up   Dispostion:  After consideration of the diagnostic results and the patients response to treatment, I feel that the patent would benefit from outpatient management.    Final Clinical Impression(s) / ED Diagnoses Final diagnoses:  Fatigue, unspecified type  Chronic cough    Rx / DC Orders ED Discharge Orders          Ordered    dextromethorphan-guaiFENesin [facility] DM) 30-600 MG 12hr tablet  2 times daily        06/29/23 1637              Judithann Sheen, PA 06/29/23 2242    Pricilla Loveless, MD 06/30/23 [phone]

## 2023-06-29 NOTE — Discharge Instructions (Addendum)
Thank you for letting us evaluate you today.  Your heart enzyme was negative for injury.  Your other lab work was within normal limits.  You are negative for strep, COVID, flu, RSV.  Your urine is clear for infection.  I have provided you with [place] [facility] for you to follow-up for a primary care provider to look into other reasons why you are fatigued and feeling the symptoms.  Return to Emergency Department if you experience chest pain, shortness of breath, significant worsening symptoms

## 2023-06-29 NOTE — ED Provider Triage Note (Signed)
Emergency Medicine Provider Triage Evaluation Note  Annette Diaz , a 52 y.o. female  was evaluated in triage.  Pt complains of presented for multiple concerns.  Patient's had chest congestion/pain for the past few weeks without a cough after getting over a viral illness.  Patient states her biological father has lung cancer and she is concerned that the same but denies night sweats, fevers.  Patient also states that she thinks she may have UTI as she does have some discomfort in her urine and would like to be checked.  Review of Systems  Positive:  Negative:   Physical Exam  BP (!) 165/108 (BP Location: Left Arm)   Pulse (!) 114   Temp 98 F (36.7 C) (Oral)   Resp 18   SpO2 100%  Gen:   Awake, no distress   Resp:  Normal effort  MSK:   Moves extremities without difficulty  Other:    Medical Decision Making  Medically screening exam initiated at 11:26 AM.  Appropriate orders placed.  Annette Diaz was informed that the remainder of the evaluation will be completed by another provider, this initial triage assessment does not replace that evaluation, and the importance of remaining in the ED until their evaluation is complete.  Workup initiated, patient stable at this time, do suspect bronchitis.   Netta Corrigan, PA-C 06/29/23 1128

## 2023-12-10 ENCOUNTER — Encounter (HOSPITAL_COMMUNITY): Payer: Self-pay

## 2023-12-10 ENCOUNTER — Telehealth (HOSPITAL_COMMUNITY): Payer: Self-pay | Admitting: Internal Medicine

## 2023-12-10 ENCOUNTER — Ambulatory Visit (HOSPITAL_COMMUNITY)
Admission: EM | Admit: 2023-12-10 | Discharge: 2023-12-10 | Disposition: A | Discharge status: Home / Self Care | Attending: Internal Medicine | Admitting: Internal Medicine

## 2023-12-10 DIAGNOSIS — J069 Acute upper respiratory infection, unspecified: Secondary | ICD-10-CM | POA: Insufficient documentation

## 2023-12-10 DIAGNOSIS — B379 Candidiasis, unspecified: Secondary | ICD-10-CM

## 2023-12-10 DIAGNOSIS — R350 Frequency of micturition: Secondary | ICD-10-CM | POA: Insufficient documentation

## 2023-12-10 DIAGNOSIS — J020 Streptococcal pharyngitis: Secondary | ICD-10-CM | POA: Insufficient documentation

## 2023-12-10 DIAGNOSIS — Z3202 Encounter for pregnancy test, result negative: Secondary | ICD-10-CM | POA: Diagnosis not present

## 2023-12-10 LAB — POCT URINALYSIS DIP (MANUAL ENTRY)
Glucose, UA: NEGATIVE mg/dL
Leukocytes, UA: NEGATIVE
Nitrite, UA: NEGATIVE
Protein Ur, POC: 100 mg/dL — AB
Spec Grav, UA: 1.02 (ref 1.010–1.025)
Urobilinogen, UA: >=8 U/dL — AB
pH, UA: 6 (ref 5.0–8.0)

## 2023-12-10 LAB — POCT FASTING CBG KUC MANUAL ENTRY: POCT Glucose (KUC): 134 mg/dL — AB (ref 70–99)

## 2023-12-10 LAB — POCT URINE PREGNANCY: Preg Test, Ur: NEGATIVE

## 2023-12-10 LAB — POC SARS CORONAVIRUS 2 AG -  ED: SARS Coronavirus 2 Ag: NEGATIVE

## 2023-12-10 LAB — POCT RAPID STREP A (OFFICE): Rapid Strep A Screen: POSITIVE — AB

## 2023-12-10 MED ORDER — FLUCONAZOLE 150 MG PO TABS: 150.0000 mg | ORAL_TABLET | ORAL | 0 refills | Status: AC

## 2023-12-10 MED ORDER — ACETAMINOPHEN 325 MG PO TABS
975.0000 mg | ORAL_TABLET | Freq: Once | ORAL | Status: AC
  Administered 2023-12-10: 975 mg via ORAL

## 2023-12-10 MED ORDER — ACETAMINOPHEN 325 MG PO TABS
ORAL_TABLET | ORAL | Status: AC
  Filled 2023-12-10: qty 3

## 2023-12-10 MED ORDER — AMOXICILLIN 500 MG PO CAPS: 500.0000 mg | ORAL_CAPSULE | Freq: Two times a day (BID) | ORAL | 0 refills | Status: AC

## 2023-12-10 NOTE — Discharge Instructions (Signed)
 You have strep throat.  - Take the prescribed antibiotic as directed for the next 10 days. - Take ibuprofen  600 mg every 6 hours as needed for throat pain and fever.  - Change your toothbrush after 2 to 3 days of antibiotic use to prevent reinfection. - You may also gargle with salt water and drink warm tea with honey to soothe your throat.   COVID testing is pending, staff will call you if this is positive. Wear a mask for 5 days of symptoms while you are in public, then you may remove your mask. You may go back to work if you do not have a fever for 24 hours without any medicines.   Your urine culture is pending.  Staff will call you if your urine shows any signs of infection.  Blood sugar was normal.  Continue to follow-up with PCP as needed for new or worsening symptoms.

## 2023-12-10 NOTE — Telephone Encounter (Signed)
 Empiric treatment for yeast vaginitis with Diflucan was not sent in at time of encounter.  This has been ordered in telephone encounter as discussed at time of encounter.

## 2023-12-10 NOTE — ED Triage Notes (Signed)
 Pt c/o fever, sore throat, chills, and diarrhea since yesterday. Last tylenol  last night. States her daughter and granddaughter is positive for COVID and strep.  Pt states having burning on urination with vaginal irritation x3wks after having an allergic reaction to her DM meds. Was treated for yeast with no relief.

## 2023-12-10 NOTE — ED Provider Notes (Signed)
 [facility]    CSN: [id number] Arrival date & time: 12/10/23  0807      History   Chief Complaint Chief Complaint  Patient presents with   Fever   Sore Throat    HPI Annette Diaz is a 53 y.o. female.   Annette Diaz is a 53 y.o. female presenting for chief complaint of Fever, nasal congestion, diarrhea, body aches, fatigue, and Sore Throat that started yesterday. She was recently exposed to her daughter/granddaughter who both have COVID and strep throat. She had a 101.0 fever at home yesterday, this has responded well to tylenol . Sore throat is worsened by swallowing. Reports multiple episodes of non-bloody diarrhea starting yesterday as well.  Denies nausea, vomiting, cough, dizziness, chest pain, shortness of breath, rash, and recent antibiotic/steroid use in the last 90 days.  Never smoker, no history of asthma/copd.   She additionally reports urinary frequency that started 3 weeks ago.  History of type 2 diabetes.  States she was recently started on Jardiance due to elevated hemoglobin A1c (she cannot remember exact number of hemoglobin A1c and I am unable to find this in the chart).  She took Jardiance for 3 days and states that she started having significant urinary frequency and vaginal yeast infection symptoms so she stopped the medication.  She was treated for yeast vaginitis with dose of fluconazole.  She remains with persistent urinary frequency and wonders if this is due to increased water intake.  Denies dysuria, urinary urgency, abdominal pain, flank pain, gross hematuria, vaginal symptoms, dizziness, and headaches.  Diabetes is managed by PCP, she is not currently taking any medications for diabetes.   Fever Sore Throat    Past Medical History:  Diagnosis Date   Depression    GERD (gastroesophageal reflux disease)    Hypertension     Patient Active Problem List   Diagnosis Date Noted   S/P myomectomy 07/14/2015    Past Surgical History:   Procedure Laterality Date   BREAST SURGERY Bilateral 2018   reduction   IR RADIOLOGIST EVAL & MGMT  11/19/2018   IUD REMOVAL N/A 07/14/2015   Procedure: INTRAUTERINE DEVICE (IUD) REMOVAL;  Surgeon: Dickie Carder, MD;  Location: [facility];  Service: Gynecology;  Laterality: N/A;   MYOMECTOMY N/A 07/14/2015   Procedure: Exploratory Laparotomy MYOMECTOMY;  Surgeon: Dickie Carder, MD;  Location: [facility];  Service: Gynecology;  Laterality: N/A;   NO PAST SURGERIES     UMBILICAL HERNIA REPAIR N/A 04/23/2019   Procedure: LAPAROSCOPIC UMBILICAL HERNIA REPAIR  WITH MESH;  Surgeon: Rubin Calamity, MD;  Location: [facility];  Service: General;  Laterality: N/A;    OB History   No obstetric history on file.      Home Medications    Prior to Admission medications   Medication Sig Start Date End Date Taking? Authorizing Provider  amoxicillin (AMOXIL) 500 MG capsule Take 1 capsule (500 mg total) by mouth 2 (two) times daily for 10 days. 12/10/23 12/20/23 Yes Emmery Seiler, Dorna HERO, FNP  ALPRAZolam (XANAX) 1 MG tablet Take 1 mg by mouth at bedtime as needed for anxiety or sleep.    [provider]  Ascorbic Acid (VITAMIN C PO) Take 1 tablet by mouth once a week.    [provider]  ASHWAGANDHA PO Take 1 capsule by mouth See admin instructions. Take 1 capsule by mouth 5 days per week    [provider]  Biotin 5 MG CAPS Take 5 mg by mouth daily.  [provider]  Cranberry-Vitamin C (AZO CRANBERRY URINARY TRACT PO) Take 2 tablets by mouth daily.    [provider]  Digestive Enzymes (PAPAYA AND ENZYMES PO) Take 4 tablets by mouth daily as needed (feeling full).    [provider]  ELDERBERRY PO Take 1 capsule by mouth once a week.    [provider]  losartan -hydrochlorothiazide  (HYZAAR) 100-12.5 MG tablet Take 1 tablet by mouth daily.    [provider]  omeprazole (PRILOSEC) 40 MG capsule Take 40 mg by mouth daily.    [provider]  TRINTELLIX 5 MG TABS tablet Take 5 mg by mouth daily.    [provider]  VITAMIN D PO Take 1 capsule by mouth once a week.    [provider]    Family History History reviewed. No pertinent family history.  Social History Social History   Tobacco Use   Smoking status: Never  Substance Use Topics   Alcohol use: No   Drug use: No     Allergies   Jardiance [empagliflozin]   Review of Systems Review of Systems  Constitutional:  Positive for fever.  Per HPI   Physical Exam Triage Vital Signs ED Triage Vitals [12/10/23 0842]  Encounter Vitals Group     BP 125/78     Girls Systolic BP Percentile      Girls Diastolic BP Percentile      Boys Systolic BP Percentile      Boys Diastolic BP Percentile      Pulse Rate (!) 123     Resp 18     Temp (!) 101.1 F (38.4 C)     Temp Source Oral     SpO2 98 %     Weight      Height      Head Circumference      Peak Flow      Pain Score 0     Pain Loc      Pain Education      Exclude from Growth Chart    No data found.  Updated Vital Signs BP 125/78 (BP Location: Left Arm)   Pulse (!) 123   Temp (!) 101.1 F (38.4 C) (Oral)   Resp 18   LMP 06/25/2015   SpO2 98%   Visual Acuity Right Eye Distance:   Left Eye Distance:   Bilateral Distance:    Right Eye Near:   Left Eye Near:    Bilateral Near:     Physical Exam Vitals and nursing note reviewed.  Constitutional:      Appearance: She is ill-appearing. She is not toxic-appearing.  HENT:     Head: Normocephalic and atraumatic.     Right Ear: Hearing, ear canal and external ear normal. Tympanic membrane is erythematous.     Left Ear: Hearing, ear canal and external ear normal. Tympanic membrane is erythematous.     Nose: Congestion present.     Mouth/Throat:     Lips: Pink.     Mouth: Mucous membranes are moist. No injury or oral lesions.     Dentition: Normal dentition.     Tongue: No lesions.     Pharynx: Uvula midline.  Pharyngeal swelling and posterior oropharyngeal erythema present. No oropharyngeal exudate, uvula swelling or postnasal drip.     Tonsils: No tonsillar exudate. 2+ on the right. 2+ on the left.     Comments: No trismus, phonation normal, maintaining secretions without difficulty.    Eyes:  General: Lids are normal. Vision grossly intact. Gaze aligned appropriately.     Extraocular Movements: Extraocular movements intact.     Conjunctiva/sclera: Conjunctivae normal.   Neck:     Trachea: Trachea and phonation normal.   Cardiovascular:     Rate and Rhythm: Normal rate and regular rhythm.     Heart sounds: Normal heart sounds, S1 normal and S2 normal.  Pulmonary:     Effort: Pulmonary effort is normal. No respiratory distress.     Breath sounds: Normal breath sounds and air entry. No wheezing, rhonchi or rales.  Chest:     Chest wall: No tenderness.  Abdominal:     General: Bowel sounds are normal.     Palpations: Abdomen is soft.     Tenderness: There is no abdominal tenderness. There is no right CVA tenderness, left CVA tenderness or guarding.   Musculoskeletal:     Cervical back: Neck supple.  Lymphadenopathy:     Cervical: No cervical adenopathy.   Skin:    General: Skin is warm and dry.     Capillary Refill: Capillary refill takes less than 2 seconds.     Findings: No rash.   Neurological:     General: No focal deficit present.     Mental Status: She is alert and oriented to person, place, and time. Mental status is at baseline.     Cranial Nerves: No dysarthria or facial asymmetry.   Psychiatric:        Mood and Affect: Mood normal.        Speech: Speech normal.        Behavior: Behavior normal.        Thought Content: Thought content normal.        Judgment: Judgment normal.      UC Treatments / Results  Labs (all labs ordered are listed, but only abnormal results are displayed) Labs Reviewed  POCT RAPID STREP A (OFFICE) - Abnormal; Notable for the  following components:      Result Value   Rapid Strep A Screen Positive (*)    All other components within normal limits  POCT URINALYSIS DIP (MANUAL ENTRY) - Abnormal; Notable for the following components:   Color, UA straw (*)    Clarity, UA turbid (*)    Bilirubin, UA small (*)    Ketones, POC UA moderate (40) (*)    Blood, UA moderate (*)    Protein Ur, POC =100 (*)    Urobilinogen, UA >=8.0 (*)    All other components within normal limits  POCT FASTING CBG [facility] MANUAL ENTRY - Abnormal; Notable for the following components:   POCT Glucose ([facility]) 134 (*)    All other components within normal limits  POCT URINE PREGNANCY - Normal  SARS CORONAVIRUS 2 (TAT 6-24 HRS)  URINE CULTURE  POC SARS CORONAVIRUS 2 AG -  ED    EKG   Radiology No results found.  Procedures Procedures (including critical care time)  Medications Ordered in UC Medications  acetaminophen  (TYLENOL ) tablet 975 mg (975 mg Oral Given 12/10/23 9147)    Initial Impression / Assessment and Plan / UC Course  I have reviewed the triage vital signs and the nursing notes.  Pertinent labs & imaging results that were available during my care of the patient were reviewed by me and considered in my medical decision making (see chart for details).   1.  Streptococcal sore throat POC strep testing is positive.   Amoxicillin sent to pharmacy to  be taken as prescribed.  OTC medications like tylenol /ibuprofen  as needed for throat pain and fever/chills.   Advised to change toothbrush after 2 to 3 days of antibiotic use to prevent reinfection.   Salt water gargles and warm tea with honey may be used as needed to soothe sore throat.   Exposure to COVID, COVID-19 testing point-of-care negative here, PCR test is pending. Patient is candidate for antiviral therapy: Yes Medication recommendation: nirmatrelvir ritonavir eGFR: Greater than 60 (January 2025) Renal Dosing: No Medication adjustment while on antiviral therapy: No  Xanax while taking Paxlovid.  Urinalysis is unremarkable for signs of UTI today, however I would like to send urine for culture due to persistent urinary frequency.  We will treat based on urine culture for UTI. CBG in clinic is 134.  No red flags on exam, no signs of pyelonephritis.   Counseled patient on potential for adverse effects with medications prescribed/recommended today, strict ER and return-to-clinic precautions discussed, patient verbalized understanding.    Final Clinical Impressions(s) / UC Diagnoses   Final diagnoses:  Streptococcal sore throat  Urinary frequency  Viral URI     Discharge Instructions      You have strep throat.  - Take the prescribed antibiotic as directed for the next 10 days. - Take ibuprofen  600 mg every 6 hours as needed for throat pain and fever.  - Change your toothbrush after 2 to 3 days of antibiotic use to prevent reinfection. - You may also gargle with salt water and drink warm tea with honey to soothe your throat.   COVID testing is pending, staff will call you if this is positive. Wear a mask for 5 days of symptoms while you are in public, then you may remove your mask. You may go back to work if you do not have a fever for 24 hours without any medicines.   Your urine culture is pending.  Staff will call you if your urine shows any signs of infection.  Blood sugar was normal.  Continue to follow-up with PCP as needed for new or worsening symptoms.      ED Prescriptions     Medication Sig Dispense Auth. Provider   amoxicillin (AMOXIL) 500 MG capsule Take 1 capsule (500 mg total) by mouth 2 (two) times daily for 10 days. 20 capsule Enedelia Dorna HERO, FNP      PDMP not reviewed this encounter.   Enedelia Dorna HERO, [place] 12/10/23 1027

## 2023-12-11 ENCOUNTER — Ambulatory Visit (HOSPITAL_COMMUNITY): Payer: Self-pay

## 2023-12-11 LAB — URINE CULTURE

## 2023-12-12 LAB — SARS CORONAVIRUS 2 (TAT 6-24 HRS): SARS Coronavirus 2: NEGATIVE
